# Patient Record
Sex: Male | Born: 1960 | Race: Black or African American | Hispanic: No | Marital: Married | Smoking: Never smoker
Health system: Southern US, Community
[De-identification: ages and names within clinical notes are randomized; demographics above are authoritative.]

## PROBLEM LIST (undated history)

## (undated) DIAGNOSIS — G894 Chronic pain syndrome: Secondary | ICD-10-CM

## (undated) DIAGNOSIS — U071 COVID-19: Secondary | ICD-10-CM

---

## 2019-06-23 ENCOUNTER — Ambulatory Visit: Payer: Self-pay | Admitting: Orthopaedic Surgery

## 2020-02-20 ENCOUNTER — Inpatient Hospital Stay (HOSPITAL_COMMUNITY)
Admission: EM | Admit: 2020-02-20 | Discharge: 2020-02-22 | DRG: 177 | Disposition: A | Discharge status: Home / Self Care | Payer: Medicaid Other | Attending: Family Medicine | Admitting: Family Medicine

## 2020-02-20 ENCOUNTER — Encounter (HOSPITAL_COMMUNITY): Payer: Self-pay

## 2020-02-20 ENCOUNTER — Emergency Department (HOSPITAL_COMMUNITY): Payer: Medicaid Other

## 2020-02-20 ENCOUNTER — Other Ambulatory Visit: Payer: Self-pay

## 2020-02-20 DIAGNOSIS — Z79891 Long term (current) use of opiate analgesic: Secondary | ICD-10-CM

## 2020-02-20 DIAGNOSIS — J1282 Pneumonia due to coronavirus disease 2019: Secondary | ICD-10-CM | POA: Diagnosis present

## 2020-02-20 DIAGNOSIS — I2694 Multiple subsegmental pulmonary emboli without acute cor pulmonale: Secondary | ICD-10-CM | POA: Diagnosis present

## 2020-02-20 DIAGNOSIS — U071 COVID-19: Principal | ICD-10-CM | POA: Diagnosis present

## 2020-02-20 DIAGNOSIS — F17210 Nicotine dependence, cigarettes, uncomplicated: Secondary | ICD-10-CM | POA: Diagnosis present

## 2020-02-20 DIAGNOSIS — G8929 Other chronic pain: Secondary | ICD-10-CM | POA: Diagnosis present

## 2020-02-20 DIAGNOSIS — D72819 Decreased white blood cell count, unspecified: Secondary | ICD-10-CM | POA: Diagnosis present

## 2020-02-20 DIAGNOSIS — M25569 Pain in unspecified knee: Secondary | ICD-10-CM | POA: Diagnosis present

## 2020-02-20 DIAGNOSIS — D696 Thrombocytopenia, unspecified: Secondary | ICD-10-CM | POA: Diagnosis present

## 2020-02-20 DIAGNOSIS — K5900 Constipation, unspecified: Secondary | ICD-10-CM | POA: Diagnosis present

## 2020-02-20 DIAGNOSIS — Z86711 Personal history of pulmonary embolism: Secondary | ICD-10-CM

## 2020-02-20 DIAGNOSIS — I2699 Other pulmonary embolism without acute cor pulmonale: Secondary | ICD-10-CM | POA: Diagnosis present

## 2020-02-20 LAB — CBC WITH DIFFERENTIAL/PLATELET
Abs Immature Granulocytes: 0.01 10*3/uL (ref 0.00–0.07)
Basophils Absolute: 0 10*3/uL (ref 0.0–0.1)
Basophils Relative: 0 %
Eosinophils Absolute: 0 10*3/uL (ref 0.0–0.5)
Eosinophils Relative: 0 %
HCT: 44.9 % (ref 39.0–52.0)
Hemoglobin: 13.7 g/dL (ref 13.0–17.0)
Immature Granulocytes: 0 %
Lymphocytes Relative: 19 %
Lymphs Abs: 0.7 10*3/uL (ref 0.7–4.0)
MCH: 22.8 pg — ABNORMAL LOW (ref 26.0–34.0)
MCHC: 30.5 g/dL (ref 30.0–36.0)
MCV: 74.7 fL — ABNORMAL LOW (ref 80.0–100.0)
Monocytes Absolute: 0.3 10*3/uL (ref 0.1–1.0)
Monocytes Relative: 9 %
Neutro Abs: 2.5 10*3/uL (ref 1.7–7.7)
Neutrophils Relative %: 72 %
Platelets: 142 10*3/uL — ABNORMAL LOW (ref 150–400)
RBC: 6.01 MIL/uL — ABNORMAL HIGH (ref 4.22–5.81)
RDW: 14.1 % (ref 11.5–15.5)
WBC: 3.5 10*3/uL — ABNORMAL LOW (ref 4.0–10.5)
nRBC: 0 % (ref 0.0–0.2)

## 2020-02-20 LAB — TROPONIN I (HIGH SENSITIVITY)
Troponin I (High Sensitivity): 5 ng/L (ref <18)
Troponin I (High Sensitivity): 5 ng/L (ref <18)

## 2020-02-20 LAB — BASIC METABOLIC PANEL
Anion gap: 10 (ref 5–15)
BUN: 11 mg/dL (ref 6–20)
CO2: 27 mmol/L (ref 22–32)
Calcium: 8.4 mg/dL — ABNORMAL LOW (ref 8.9–10.3)
Chloride: 100 mmol/L (ref 98–111)
Creatinine, Ser: 0.8 mg/dL (ref 0.61–1.24)
GFR calc Af Amer: >60 mL/min (ref >60)
GFR calc non Af Amer: >60 mL/min (ref >60)
Glucose, Bld: 107 mg/dL — ABNORMAL HIGH (ref 70–99)
Potassium: 3.7 mmol/L (ref 3.5–5.1)
Sodium: 137 mmol/L (ref 135–145)

## 2020-02-20 LAB — APTT: aPTT: 33 seconds (ref 24–36)

## 2020-02-20 LAB — PROTIME-INR
INR: 1.1 (ref 0.8–1.2)
Prothrombin Time: 13.8 seconds (ref 11.4–15.2)

## 2020-02-20 MED ORDER — IOHEXOL 350 MG/ML SOLN
100.0000 mL | Freq: Once | INTRAVENOUS | Status: AC | PRN
  Administered 2020-02-20: 100 mL via INTRAVENOUS

## 2020-02-20 MED ORDER — HEPARIN (PORCINE) 25000 UT/250ML-% IV SOLN
1300.0000 [IU]/h | INTRAVENOUS | Status: DC
  Administered 2020-02-21: 1300 [IU]/h via INTRAVENOUS
  Filled 2020-02-20: qty 250

## 2020-02-20 MED ORDER — FENTANYL CITRATE (PF) 100 MCG/2ML IJ SOLN
25.0000 ug | Freq: Once | INTRAMUSCULAR | Status: AC
  Administered 2020-02-20: 25 ug via INTRAVENOUS
  Filled 2020-02-20: qty 2

## 2020-02-20 MED ORDER — SODIUM CHLORIDE 0.9 % IV BOLUS
1000.0000 mL | Freq: Once | INTRAVENOUS | Status: AC
  Administered 2020-02-20: 1000 mL via INTRAVENOUS

## 2020-02-20 MED ORDER — HEPARIN BOLUS VIA INFUSION
5000.0000 [IU] | Freq: Once | INTRAVENOUS | Status: AC
  Administered 2020-02-21: 5000 [IU] via INTRAVENOUS
  Filled 2020-02-20: qty 5000

## 2020-02-20 NOTE — ED Triage Notes (Signed)
Pt BIB EMS from home. Pt tested COVID+ 5/17 and is c/o SOB. EMS reports 12 lead unremarkable. Pt ambulated from wheelchair with EMS to stretcher in room.  117/72 84 HR 96% RA

## 2020-02-20 NOTE — ED Provider Notes (Signed)
[facility]-EMERGENCY DEPT Provider Note   CSN: [id number] Arrival date & time: 02/20/20  1936     History Chief Complaint  Patient presents with  . COVID+  . Shortness of Breath    Brandon Jones is a 59 y.o. male with a past medical history of positive Covid test on 02/12/2020 after exposure to wife with Covid, PE not currently on anticoagulation presenting to the ED with a chief complaint of chest pain, shortness of breath.  States that he was asymptomatic when he was initially exposed to Covid.  Reports about 3 to 4 days ago started experiencing left-sided chest pain along with dyspnea on exertion.  Reports chest pain is worse with taking a deep breath.  He states this feels similar to the time that he was diagnosed with a PE about 2 years ago.  He was told to take his anticoagulant dedication for about 6 months and then discontinued it.  He was never told what his PE was provoked by.  He reports constipation for the past 4 days as well.  He has not tried any medications to help with his constipation.  He has been taking Aleve with some improvement in his chills.  He denies any leg swelling, vomiting, wheezing, urinary symptoms, injuries or falls. States that he takes hydrocodone daily for his chronic knee pain.  HPI     History reviewed. No pertinent past medical history.  There are no problems to display for this patient.   History reviewed. No pertinent surgical history.     History reviewed. No pertinent family history.  Social History   Tobacco Use  . Smoking status: Not on file  Substance Use Topics  . Alcohol use: Not on file  . Drug use: Not on file    Home Medications Prior to Admission medications   Medication Sig Start Date End Date Taking? Authorizing Provider  oxyCODONE-acetaminophen (PERCOCET) 7.5-325 MG tablet Take 1 tablet by mouth 3 (three) times daily as needed for moderate pain.  02/11/20  Yes [provider]     Allergies    Patient has no known allergies.  Review of Systems   Review of Systems  Constitutional: Negative for appetite change, chills and fever.  HENT: Negative for ear pain, rhinorrhea, sneezing and sore throat.   Eyes: Negative for photophobia and visual disturbance.  Respiratory: Positive for cough and shortness of breath. Negative for chest tightness and wheezing.   Cardiovascular: Positive for chest pain. Negative for palpitations.  Gastrointestinal: Positive for constipation. Negative for abdominal pain, blood in stool, diarrhea, nausea and vomiting.  Genitourinary: Negative for dysuria, hematuria and urgency.  Musculoskeletal: Negative for myalgias.  Skin: Negative for rash.  Neurological: Negative for dizziness, weakness and light-headedness.    Physical Exam Updated Vital Signs BP 104/67   Pulse 64   Temp 98 F (36.7 C) (Oral)   Resp (!) 22   SpO2 95%   Physical Exam Vitals and nursing note reviewed.  Constitutional:      General: He is not in acute distress.    Appearance: He is well-developed.  HENT:     Head: Normocephalic and atraumatic.     Nose: Nose normal.  Eyes:     General: No scleral icterus.       Left eye: No discharge.     Conjunctiva/sclera: Conjunctivae normal.  Cardiovascular:     Rate and Rhythm: Normal rate and regular rhythm.     Heart sounds: Normal heart sounds. No murmur.  No friction rub. No gallop.   Pulmonary:     Effort: Pulmonary effort is normal. Tachypnea present. No respiratory distress.     Breath sounds: Normal breath sounds.  Abdominal:     General: Bowel sounds are normal. There is no distension.     Palpations: Abdomen is soft.     Tenderness: There is no abdominal tenderness. There is no guarding.  Musculoskeletal:        General: Normal range of motion.     Cervical back: Normal range of motion and neck supple.     Right lower leg: No tenderness. No edema.     Left lower leg: No tenderness. No edema.  Skin:     General: Skin is warm and dry.     Findings: No rash.  Neurological:     Mental Status: He is alert.     Motor: No abnormal muscle tone.     Coordination: Coordination normal.     ED Results / Procedures / Treatments   Labs (all labs ordered are listed, but only abnormal results are displayed) Labs Reviewed  BASIC METABOLIC PANEL - Abnormal; Notable for the following components:      Result Value   Glucose, Bld 107 (*)    Calcium 8.4 (*)    All other components within normal limits  CBC WITH DIFFERENTIAL/PLATELET - Abnormal; Notable for the following components:   WBC 3.5 (*)    RBC 6.01 (*)    MCV 74.7 (*)    MCH 22.8 (*)    Platelets 142 (*)    All other components within normal limits  TROPONIN I (HIGH SENSITIVITY)  TROPONIN I (HIGH SENSITIVITY)    EKG None ED ECG REPORT   Date: 02/20/2020  Rate: 65   Rhythm: normal sinus rhythm  QRS Axis: normal  Intervals: normal  ST/T Wave abnormalities: normal  Conduction Disutrbances:none  Narrative Interpretation:   Old EKG Reviewed: none available  I have personally reviewed the EKG tracing and agree with the computerized printout as noted.  Radiology DG Chest Portable 1 View  Result Date: 02/20/2020 CLINICAL DATA:  Shortness of breath. COVID positive 02/12/2020. Cough. EXAM: PORTABLE CHEST 1 VIEW COMPARISON:  None. FINDINGS: Low lung volumes. Normal heart size for technique. Patchy and streaky opacities in both lung bases. Vague opacity in the right suprahilar lung. No pleural effusion or pneumothorax. No acute osseous abnormalities are seen. IMPRESSION: Low lung volumes with patchy bilateral lung opacities, suspicious for multifocal pneumonia in the setting of COVID-19. There is likely an element of superimposed atelectasis at the left lung base. Electronically Signed   By: Narda Rutherford M.D.   On: 02/20/2020 20:43    Procedures .Critical Care Performed by: Dietrich Pates, PA-C Authorized by: Dietrich Pates, PA-C    Critical care provider statement:    Critical care time (minutes):  35   Critical care was necessary to treat or prevent imminent or life-threatening deterioration of the following conditions:  Cardiac failure, circulatory failure and respiratory failure   Critical care was time spent personally by me on the following activities:  Development of treatment plan with patient or surrogate, discussions with consultants, evaluation of patient's response to treatment, examination of patient, obtaining history from patient or surrogate, ordering and review of laboratory studies, ordering and review of radiographic studies, pulse oximetry and re-evaluation of patient's condition   I assumed direction of critical care for this patient from another provider in my specialty: no     (including critical  care time)  Medications Ordered in ED Medications  sodium chloride 0.9 % bolus 1,000 mL (0 mLs Intravenous Stopped 02/20/20 2219)  fentaNYL (SUBLIMAZE) injection 25 mcg (25 mcg Intravenous Given 02/20/20 2037)  iohexol (OMNIPAQUE) 350 MG/ML injection 100 mL (100 mLs Intravenous Contrast Given 02/20/20 2205)    ED Course  I have reviewed the triage vital signs and the nursing notes.  Pertinent labs & imaging results that were available during my care of the patient were reviewed by me and considered in my medical decision making (see chart for details).    MDM Rules/Calculators/A&P                      59 year old male with a past medical history of Covid, positive test on 02/12/2020 after exposure to wife with Covid, prior PE not currently on anticoagulation presenting to the ED with a chief complaint of chest pain or shortness of breath.  Symptoms began 3 to 4 days ago.  Reports dyspnea on exertion.  Pain is worse with taking a deep breath.  He states that this was similar to the time he was diagnosed with PE about 2 years ago.  He is no longer on anticoagulation.  He was never told what provoked his PE.   Denies any leg swelling.  Patient with oxygen saturations on low 90s on exam.  He is not tachycardic.  He is tachypneic.  No lower extremity edema, erythema or calf tenderness that concern me for DVT.  Work-up here significant for white count of 3.5, troponin negative.  EKG shows sinus rhythm, no ischemic changes.  Chest x-ray findings consistent with Covid pneumonia.  CT angio of the chest done due to his Covid infection as well as history of PE.  He does have acute bilateral PEs with evidence of right heart strain.  Will order heparin, admit patient to hospital service for ongoing management of his PE.  All imaging, if done today, including plain films, CT scans, and ultrasounds, independently reviewed by me, and interpretations confirmed via formal radiology reads.  Portions of this note were generated with [employer]. Dictation errors may occur despite best attempts at proofreading.  Final Clinical Impression(s) / ED Diagnoses Final diagnoses:  Multiple subsegmental pulmonary emboli without acute cor pulmonale ([place])  COVID-19 virus infection    Rx / DC Orders ED Discharge Orders    None       Delia Heady, PA-C 02/20/20 2232    Quintella Reichert, MD 02/22/20 [phone]

## 2020-02-20 NOTE — H&P (Addendum)
History and Physical   [facility] Admission History and Physical [employer], D.O.    Patient Name: Brandon Jones MR#: [id number] Date of Birth: [birth year] Date of Admission: 02/20/2020  Referring MD/NP/PA: PA Khatri  Primary Care Physician: System, Pcp Not In  Chief Complaint:  Chief Complaint  Patient presents with  . COVID+  . Shortness of Breath    HPI: Brandon Jones is a 59 y.o. male with a known history of chronic knee pain, PE 2 years ago, unknown etiology, was on [facility] for 6 months and then stopped.  presents to the emergency department for evaluation of shortness of breath.  Patient was exposed to his wife who had Covid and he himself was diagnosed with Covid positive on May 17, but was asymptomatic.  Four days ago he reports the onset of left-sided chest pain with dyspnea on exertion, worse with inspiration.  He also reports constipation for the past 4 days.  Patient denies fevers/chills, weakness, dizziness, chest pain, shortness of breath, N/V/C/D, abdominal pain, dysuria/frequency, changes in mental status.    Otherwise there has been no change in status. Patient has been taking medication as prescribed and there has been no recent change in medication or diet.  No recent antibiotics.  There has been no recent illness, hospitalizations, travel or sick contacts.    EMS/ED Course: Patient received fentanyl, heparin, normal saline. Medical admission has been requested for further management of bilateral PE.  Review of Systems:  CONSTITUTIONAL: No fever/chills, fatigue, weakness, weight gain/loss, headache. EYES: No blurry or double vision. ENT: No tinnitus, postnasal drip, redness or soreness of the oropharynx. RESPIRATORY: Positive dyspnea on exertion.  No cough, wheeze.  No hemoptysis.  CARDIOVASCULAR: Positive chest pain, negative palpitations, syncope, orthopnea. No lower extremity edema.  GASTROINTESTINAL: Positive  constipation.  No nausea, vomiting, abdominal pain, diarrhea. No hematemesis, melena or hematochezia. GENITOURINARY: No dysuria, frequency, hematuria. ENDOCRINE: No polyuria or nocturia. No heat or cold intolerance. HEMATOLOGY: No anemia, bruising, bleeding. INTEGUMENTARY: No rashes, ulcers, lesions. MUSCULOSKELETAL: No arthritis, gout, dyspnea. NEUROLOGIC: No numbness, tingling, ataxia, seizure-type activity, weakness. PSYCHIATRIC: No anxiety, depression, insomnia.   Past medical history: Chronic knee pain, PE, Covid + 02/12/2020  History reviewed. No pertinent surgical history.  Smokes 4-5 cigarettes / day  No Known Allergies  History reviewed. No pertinent family history.  Prior to Admission medications   Medication Sig Start Date End Date Taking? Authorizing Provider  oxyCODONE-acetaminophen (PERCOCET) 7.5-325 MG tablet Take 1 tablet by mouth 3 (three) times daily as needed for moderate pain.  02/11/20  Yes [provider]    Physical Exam: Vitals:   02/20/20 1951 02/20/20 2030 02/20/20 2100 02/20/20 2236  BP: 121/84 103/67 104/67 (!) 93/55  Pulse: 66 66 64 66  Resp: 20 (!) 24 (!) 22 19  Temp: 98 F (36.7 C)     TempSrc: Oral     SpO2: 98% (!) 89% 95% 96%    GENERAL: 59 y.o.-year-old male patient, well-developed, well-nourished lying in the bed in no acute distress.  Pleasant and cooperative.   HEENT: Head atraumatic, normocephalic. Pupils equal. Mucus membranes moist. NECK: Supple. No JVD. CHEST: Normal breath sounds bilaterally. No wheezing, rales, rhonchi or crackles. No use of accessory muscles of respiration.  No reproducible chest wall tenderness.  CARDIOVASCULAR: S1, S2 normal. No murmurs, rubs, or gallops. Cap refill <2 seconds. Pulses intact distally.  ABDOMEN: Soft, NT EXTREMITIES: No pedal edema, cyanosis, or clubbing. No calf tenderness or Homan's  sign.  NEUROLOGIC: The patient is alert and oriented x 3. Cranial nerves II through XII are grossly  intact with no focal sensorimotor deficit. PSYCHIATRIC:  Normal affect, mood, thought content. SKIN: Warm, dry, and intact without obvious rash, lesion, or ulcer.    Labs on Admission:  CBC: Recent Labs  Lab 02/20/20 2015  WBC 3.5*  NEUTROABS 2.5  HGB 13.7  HCT 44.9  MCV 74.7*  PLT 607*   Basic Metabolic Panel: Recent Labs  Lab 02/20/20 2015  NA 137  K 3.7  CL 100  CO2 27  GLUCOSE 107*  BUN 11  CREATININE 0.80  CALCIUM 8.4*   GFR: CrCl cannot be calculated (Unknown ideal weight.). Liver Function Tests: No results for input(s): AST, ALT, ALKPHOS, BILITOT, PROT, ALBUMIN in the last 168 hours. No results for input(s): LIPASE, AMYLASE in the last 168 hours. No results for input(s): AMMONIA in the last 168 hours. Coagulation Profile: Recent Labs  Lab 02/20/20 2035  INR 1.1   Cardiac Enzymes: No results for input(s): CKTOTAL, CKMB, CKMBINDEX, TROPONINI in the last 168 hours. BNP (last 3 results) No results for input(s): PROBNP in the last 8760 hours. HbA1C: No results for input(s): HGBA1C in the last 72 hours. CBG: No results for input(s): GLUCAP in the last 168 hours. Lipid Profile: No results for input(s): CHOL, HDL, LDLCALC, TRIG, CHOLHDL, LDLDIRECT in the last 72 hours. Thyroid Function Tests: No results for input(s): TSH, T4TOTAL, FREET4, T3FREE, THYROIDAB in the last 72 hours. Anemia Panel: No results for input(s): VITAMINB12, FOLATE, FERRITIN, TIBC, IRON, RETICCTPCT in the last 72 hours. Urine analysis: No results found for: COLORURINE, APPEARANCEUR, LABSPEC, PHURINE, GLUCOSEU, HGBUR, BILIRUBINUR, KETONESUR, PROTEINUR, UROBILINOGEN, NITRITE, LEUKOCYTESUR Sepsis Labs: @LABRCNTIP (procalcitonin:4,lacticidven:4) )No results found for this or any previous visit (from the past 240 hour(s)).   Radiological Exams on Admission: CT Angio Chest PE W/Cm &/Or Wo Cm  Result Date: 02/20/2020 CLINICAL DATA:  Shortness of breath. COVID positive. Prior pulmonary  embolus. EXAM: CT ANGIOGRAPHY CHEST WITH CONTRAST TECHNIQUE: Multidetector CT imaging of the chest was performed using the standard protocol during bolus administration of intravenous contrast. Multiplanar CT image reconstructions and MIPs were obtained to evaluate the vascular anatomy. CONTRAST:  120mL OMNIPAQUE IOHEXOL 350 MG/ML SOLN COMPARISON:  Chest radiograph earlier this day. FINDINGS: Cardiovascular: Acute pulmonary emboli in the distal left pulmonary artery extending into the upper, lower, and lingular pulmonary arteries. Lower lobe thrombus may be partially occlusive at the basilar segment. There are small a centric filling defects within subsegmental right lower lobe pulmonary arteries that are age indeterminate. Overall thromboembolic burden is moderate. RV to LV ratio is 1. Minimal contrast refluxes into the hepatic veins and IVC. Slight straightening of the intraventricular septum. Heart is normal in size. No pericardial effusion. Mediastinum/Nodes: Enlarged right hilar node measuring 17 mm. There is a prominent subcarinal node at 13 mm. Shotty left hilar and additional mediastinal lymph nodes. No thyroid nodule. No esophageal wall thickening. Lungs/Pleura: Multifocal ground-glass opacities throughout both lungs, peripheral predominant, consistent with COVID-19 pneumonia. Linear atelectasis in the left lower lobe. Basilar evaluation is partially obscured by breathing motion artifact. Possible early pulmonary infarct at the left lung base. No significant pleural effusion. The trachea and mainstem bronchi are patent. Upper Abdomen: Minimal contrast flexing into hepatic veins and IVC. No other acute finding. Musculoskeletal: There are no acute or suspicious osseous abnormalities. Degenerative change in the thoracic and included cervical spine. Review of the MIP images confirms the above findings. IMPRESSION: 1. Positive  for acute bilateral PE, left greater than right. Thromboembolic burden is moderate.  There is CT evidence of right heart strain (RV/LV Ratio = 1) consistent with at least submassive (intermediate risk) PE. The presence of right heart strain has been associated with an increased risk of morbidity and mortality. 2. Multifocal ground-glass opacities throughout both lungs, consistent with COVID-19 pneumonia. Possible early pulmonary infarct at the left lung base. 3. Mediastinal and right hilar adenopathy is likely reactive. Critical Value/emergent results were called by telephone at the time of interpretation on 02/20/2020 at 10:22 pm to PA [facility] , who verbally acknowledged these results. Electronically Signed   By: Narda Rutherford M.D.   On: 02/20/2020 22:22   DG Chest Portable 1 View  Result Date: 02/20/2020 CLINICAL DATA:  Shortness of breath. COVID positive 02/12/2020. Cough. EXAM: PORTABLE CHEST 1 VIEW COMPARISON:  None. FINDINGS: Low lung volumes. Normal heart size for technique. Patchy and streaky opacities in both lung bases. Vague opacity in the right suprahilar lung. No pleural effusion or pneumothorax. No acute osseous abnormalities are seen. IMPRESSION: Low lung volumes with patchy bilateral lung opacities, suspicious for multifocal pneumonia in the setting of COVID-19. There is likely an element of superimposed atelectasis at the left lung base. Electronically Signed   By: Narda Rutherford M.D.   On: 02/20/2020 20:43    EKG: Normal sinus rhythm at 65 bpm with normal axis and nonspecific ST-T wave changes.   Assessment/Plan  This is a 59 y.o. male with a history of chronic knee pain, PE now being admitted with:  #.  Bilateral PEs with acute heart strain -Admit inpatient telemetry -O2, nebs as needed -Heparin drip  #.  Covid pneumonia, meeting sepsis criteria.  -IV dexamethasone -Check LFTs -Remdesivir if LFTs are wnl -Follow D-dimer, ferritin - Check resp panel, flu and sputum culture -serial lactate  #.  Chest pain, likely secondary to above -Monitor on  telemetry -Trend troponins  Admission status: Inpatient IV Fluids: Hep-Lock Diet/Nutrition: Regular Consults called: Pulmonary DVT Px: Heparin and early ambulation. Code Status: Full Code  Disposition Plan: To home in 1-2 days  All the records are reviewed and case discussed with ED provider. Management plans discussed with the patient and/or family who express understanding and agree with plan of care.  Brandon Jones D.O. on 02/20/2020 at 11:54 PM CC: Primary care physician; System, Pcp Not In   02/20/2020, 11:54 PM

## 2020-02-20 NOTE — Progress Notes (Signed)
ANTICOAGULATION CONSULT NOTE - Initial Consult  Pharmacy Consult for Heparin  Indication: pulmonary embolus  No Known Allergies  Patient Measurements:   Heparin Dosing Weight: 77.3 kg  Vital Signs: Temp: 98 F (36.7 C) (05/25 1951) Temp Source: Oral (05/25 1951) BP: 104/67 (05/25 2100) Pulse Rate: 64 (05/25 2100)  Labs: Recent Labs    02/20/20 2015  HGB 13.7  HCT 44.9  PLT 142*  CREATININE 0.80  TROPONINIHS 5    CrCl cannot be calculated (Unknown ideal weight.).   Medical History: History reviewed. No pertinent past medical history.  Medications:  Not on anticoagulation PTA  Assessment: 59 y/o M with a h/o PE no longer on anticoagulation and a recent diagnosis ov COVID-19 infection admitted with PE.   Goal of Therapy:  Heparin level 0.3-0.7 units/ml Monitor platelets by anticoagulation protocol: Yes   Plan:  Give 5000 units bolus x 1 Start heparin infusion at 1300 units/hr Check anti-Xa level in 6 hours and daily while on heparin Continue to monitor H&H and platelets  Luisa Hart D 02/20/2020,10:33 PM

## 2020-02-20 NOTE — ED Notes (Signed)
Upon arrival to room, pt requesting food and for the TV to be turned on. Pt 98% RA while speaking to RN.

## 2020-02-21 ENCOUNTER — Encounter (HOSPITAL_COMMUNITY): Payer: Self-pay | Admitting: Family Medicine

## 2020-02-21 ENCOUNTER — Inpatient Hospital Stay (HOSPITAL_COMMUNITY): Payer: Medicaid Other

## 2020-02-21 DIAGNOSIS — D72819 Decreased white blood cell count, unspecified: Secondary | ICD-10-CM | POA: Diagnosis present

## 2020-02-21 DIAGNOSIS — Z79891 Long term (current) use of opiate analgesic: Secondary | ICD-10-CM | POA: Diagnosis not present

## 2020-02-21 DIAGNOSIS — G8929 Other chronic pain: Secondary | ICD-10-CM | POA: Diagnosis present

## 2020-02-21 DIAGNOSIS — F17210 Nicotine dependence, cigarettes, uncomplicated: Secondary | ICD-10-CM | POA: Diagnosis present

## 2020-02-21 DIAGNOSIS — D696 Thrombocytopenia, unspecified: Secondary | ICD-10-CM | POA: Diagnosis present

## 2020-02-21 DIAGNOSIS — M25569 Pain in unspecified knee: Secondary | ICD-10-CM | POA: Diagnosis present

## 2020-02-21 DIAGNOSIS — J1282 Pneumonia due to coronavirus disease 2019: Secondary | ICD-10-CM | POA: Diagnosis present

## 2020-02-21 DIAGNOSIS — Z86711 Personal history of pulmonary embolism: Secondary | ICD-10-CM | POA: Diagnosis not present

## 2020-02-21 DIAGNOSIS — I2602 Saddle embolus of pulmonary artery with acute cor pulmonale: Secondary | ICD-10-CM | POA: Diagnosis not present

## 2020-02-21 DIAGNOSIS — I2699 Other pulmonary embolism without acute cor pulmonale: Secondary | ICD-10-CM

## 2020-02-21 DIAGNOSIS — I2694 Multiple subsegmental pulmonary emboli without acute cor pulmonale: Secondary | ICD-10-CM | POA: Diagnosis present

## 2020-02-21 DIAGNOSIS — K5900 Constipation, unspecified: Secondary | ICD-10-CM | POA: Diagnosis present

## 2020-02-21 DIAGNOSIS — U071 COVID-19: Secondary | ICD-10-CM | POA: Diagnosis present

## 2020-02-21 HISTORY — DX: Other pulmonary embolism without acute cor pulmonale: I26.99

## 2020-02-21 LAB — C-REACTIVE PROTEIN: CRP: 5.9 mg/dL — ABNORMAL HIGH (ref <1.0)

## 2020-02-21 LAB — TROPONIN I (HIGH SENSITIVITY)
Troponin I (High Sensitivity): 5 ng/L (ref <18)
Troponin I (High Sensitivity): 6 ng/L (ref <18)

## 2020-02-21 LAB — HEPATIC FUNCTION PANEL
ALT: 18 U/L (ref 0–44)
AST: 21 U/L (ref 15–41)
Albumin: 3 g/dL — ABNORMAL LOW (ref 3.5–5.0)
Alkaline Phosphatase: 37 U/L — ABNORMAL LOW (ref 38–126)
Bilirubin, Direct: 0.1 mg/dL (ref 0.0–0.2)
Indirect Bilirubin: 0.7 mg/dL (ref 0.3–0.9)
Total Bilirubin: 0.8 mg/dL (ref 0.3–1.2)
Total Protein: 6 g/dL — ABNORMAL LOW (ref 6.5–8.1)

## 2020-02-21 LAB — CBC
HCT: 42.1 % (ref 39.0–52.0)
Hemoglobin: 13.1 g/dL (ref 13.0–17.0)
MCH: 23.2 pg — ABNORMAL LOW (ref 26.0–34.0)
MCHC: 31.1 g/dL (ref 30.0–36.0)
MCV: 74.6 fL — ABNORMAL LOW (ref 80.0–100.0)
Platelets: 143 10*3/uL — ABNORMAL LOW (ref 150–400)
RBC: 5.64 MIL/uL (ref 4.22–5.81)
RDW: 14.1 % (ref 11.5–15.5)
WBC: 3.2 10*3/uL — ABNORMAL LOW (ref 4.0–10.5)
nRBC: 0 % (ref 0.0–0.2)

## 2020-02-21 LAB — ECHOCARDIOGRAM COMPLETE
Height: 69 in
Weight: 2592 oz

## 2020-02-21 LAB — HEPARIN LEVEL (UNFRACTIONATED)
Heparin Unfractionated: 1.03 IU/mL — ABNORMAL HIGH (ref 0.30–0.70)
Heparin Unfractionated: 0.88 IU/mL — ABNORMAL HIGH (ref 0.30–0.70)
Heparin Unfractionated: 0.57 IU/mL (ref 0.30–0.70)

## 2020-02-21 LAB — LACTATE DEHYDROGENASE: LDH: 174 U/L (ref 98–192)

## 2020-02-21 LAB — BASIC METABOLIC PANEL
Anion gap: 8 (ref 5–15)
BUN: 11 mg/dL (ref 6–20)
CO2: 25 mmol/L (ref 22–32)
Calcium: 8.4 mg/dL — ABNORMAL LOW (ref 8.9–10.3)
Chloride: 103 mmol/L (ref 98–111)
Creatinine, Ser: 0.78 mg/dL (ref 0.61–1.24)
GFR calc Af Amer: >60 mL/min (ref >60)
GFR calc non Af Amer: >60 mL/min (ref >60)
Glucose, Bld: 124 mg/dL — ABNORMAL HIGH (ref 70–99)
Potassium: 3.9 mmol/L (ref 3.5–5.1)
Sodium: 136 mmol/L (ref 135–145)

## 2020-02-21 LAB — HIV ANTIBODY (ROUTINE TESTING W REFLEX): HIV Screen 4th Generation wRfx: NONREACTIVE

## 2020-02-21 LAB — ABO/RH: ABO/RH(D): AB POS

## 2020-02-21 LAB — PHOSPHORUS: Phosphorus: 1.9 mg/dL — ABNORMAL LOW (ref 2.5–4.6)

## 2020-02-21 LAB — FERRITIN: Ferritin: 454 ng/mL — ABNORMAL HIGH (ref 24–336)

## 2020-02-21 LAB — MAGNESIUM: Magnesium: 1.9 mg/dL (ref 1.7–2.4)

## 2020-02-21 MED ORDER — SODIUM CHLORIDE 0.9 % IV SOLN: 100.0000 mg | INTRAVENOUS | Status: AC

## 2020-02-21 MED ORDER — ONDANSETRON HCL 4 MG PO TABS: 4.0000 mg | ORAL_TABLET | Freq: Four times a day (QID) | ORAL | Status: DC | PRN

## 2020-02-21 MED ORDER — MORPHINE SULFATE (PF) 2 MG/ML IV SOLN: 1.0000 mg | INTRAVENOUS | Status: DC | PRN

## 2020-02-21 MED ORDER — DEXAMETHASONE SODIUM PHOSPHATE 10 MG/ML IJ SOLN
6.0000 mg | INTRAMUSCULAR | Status: DC
  Administered 2020-02-21: 6 mg via INTRAVENOUS
  Filled 2020-02-21 (×3): qty 1

## 2020-02-21 MED ORDER — OXYCODONE-ACETAMINOPHEN 7.5-325 MG PO TABS
1.0000 | ORAL_TABLET | Freq: Three times a day (TID) | ORAL | Status: DC | PRN
  Administered 2020-02-21 – 2020-02-22 (×2): 1 via ORAL
  Filled 2020-02-21 (×2): qty 1

## 2020-02-21 MED ORDER — ONDANSETRON HCL 4 MG/2ML IJ SOLN
4.0000 mg | Freq: Four times a day (QID) | INTRAMUSCULAR | Status: DC | PRN
  Administered 2020-02-22: 4 mg via INTRAVENOUS
  Filled 2020-02-21: qty 2

## 2020-02-21 MED ORDER — SENNOSIDES-DOCUSATE SODIUM 8.6-50 MG PO TABS
1.0000 | ORAL_TABLET | Freq: Every evening | ORAL | Status: DC | PRN
  Administered 2020-02-22: 1 via ORAL
  Filled 2020-02-21: qty 1

## 2020-02-21 MED ORDER — ACETAMINOPHEN 325 MG PO TABS
650.0000 mg | ORAL_TABLET | Freq: Four times a day (QID) | ORAL | Status: DC | PRN
  Administered 2020-02-21 – 2020-02-22 (×2): 650 mg via ORAL
  Filled 2020-02-21 (×2): qty 2

## 2020-02-21 MED ORDER — GUAIFENESIN-DM 100-10 MG/5ML PO SYRP: 10.0000 mL | ORAL_SOLUTION | ORAL | Status: DC | PRN

## 2020-02-21 MED ORDER — SODIUM CHLORIDE 0.9 % IV SOLN
100.0000 mg | Freq: Every day | INTRAVENOUS | Status: DC
  Administered 2020-02-22: 100 mg via INTRAVENOUS
  Filled 2020-02-21: qty 20

## 2020-02-21 MED ORDER — ZINC SULFATE 220 (50 ZN) MG PO CAPS
220.0000 mg | ORAL_CAPSULE | Freq: Every day | ORAL | Status: DC
  Administered 2020-02-21 – 2020-02-22 (×2): 220 mg via ORAL
  Filled 2020-02-21 (×2): qty 1

## 2020-02-21 MED ORDER — ALBUTEROL SULFATE HFA 108 (90 BASE) MCG/ACT IN AERS
2.0000 | INHALATION_SPRAY | Freq: Four times a day (QID) | RESPIRATORY_TRACT | Status: DC
  Administered 2020-02-21 – 2020-02-22 (×4): 2 via RESPIRATORY_TRACT
  Filled 2020-02-21: qty 6.7

## 2020-02-21 MED ORDER — HEPARIN (PORCINE) 25000 UT/250ML-% IV SOLN
950.0000 [IU]/h | INTRAVENOUS | Status: DC
  Administered 2020-02-21: 1150 [IU]/h via INTRAVENOUS
  Administered 2020-02-21: 950 [IU]/h via INTRAVENOUS
  Filled 2020-02-21: qty 250

## 2020-02-21 MED ORDER — ACETAMINOPHEN 650 MG RE SUPP: 650.0000 mg | Freq: Four times a day (QID) | RECTAL | Status: DC | PRN

## 2020-02-21 MED ORDER — BISACODYL 5 MG PO TBEC
5.0000 mg | DELAYED_RELEASE_TABLET | Freq: Every day | ORAL | Status: DC | PRN
  Administered 2020-02-21: 5 mg via ORAL
  Filled 2020-02-21: qty 1

## 2020-02-21 MED ORDER — ASCORBIC ACID 500 MG PO TABS
500.0000 mg | ORAL_TABLET | Freq: Every day | ORAL | Status: DC
  Administered 2020-02-21 – 2020-02-22 (×2): 500 mg via ORAL
  Filled 2020-02-21 (×2): qty 1

## 2020-02-21 MED ORDER — SODIUM CHLORIDE 0.9 % IV SOLN
100.0000 mg | INTRAVENOUS | Status: AC
  Administered 2020-02-21 (×2): 100 mg via INTRAVENOUS
  Filled 2020-02-21 (×2): qty 20

## 2020-02-21 NOTE — Plan of Care (Signed)
  Problem: Education: Goal: Knowledge of risk factors and measures for prevention of condition will improve Outcome: Progressing   Problem: Coping: Goal: Psychosocial and spiritual needs will be supported Outcome: Progressing   Problem: Respiratory: Goal: Will maintain a patent airway Outcome: Progressing Goal: Complications related to the disease process, condition or treatment will be avoided or minimized Outcome: Progressing   

## 2020-02-21 NOTE — Progress Notes (Signed)
ANTICOAGULATION CONSULT NOTE - Follow Up Consult  Pharmacy Consult for heparin Indication: acute pulmonary embolus  No Known Allergies  Patient Measurements: Height: 5\' 9"  (175.3 cm) Weight: 73.5 kg (162 lb) IBW/kg (Calculated) : 70.7 Heparin Dosing Weight: 73 kg  Vital Signs: Temp: 98.5 F (36.9 C) (05/26 1456) Temp Source: Oral (05/26 1456) BP: 105/67 (05/26 1456) Pulse Rate: 60 (05/26 1456)  Labs: Recent Labs    02/20/20 2015 02/20/20 2015 02/20/20 2035 02/20/20 2220 02/21/20 0143 02/21/20 0156 02/21/20 0600 02/21/20 0608 02/21/20 1512  HGB 13.7  --   --   --   --  13.1  --   --   --   HCT 44.9  --   --   --   --  42.1  --   --   --   PLT 142*  --   --   --   --  143*  --   --   --   APTT  --   --  33  --   --   --   --   --   --   LABPROT  --   --  13.8  --   --   --   --   --   --   INR  --   --  1.1  --   --   --   --   --   --   HEPARINUNFRC  --   --   --   --   --   --  1.03*  --  0.88*  CREATININE 0.80  --   --   --   --   --  0.78  --   --   TROPONINIHS 5   < >  --  5 5  --   --  6  --    < > = values in this interval not displayed.    Estimated Creatinine Clearance: 100.6 mL/min (by C-G formula based on SCr of 0.78 mg/dL).  Assessment: Patient is a 59 y.o with hx PE two years ago (currently not on anticoag. PTA), recently diagnosed with COVID (02/12/20) presented to the ED on 5/25 with c/o SOB and chest pain. Chest CTA showed acute bilateral PE with evidence of right hear strain.  He's currently on heparin drip for acute VTE.  Today, 02/21/2020: - first heparin level is supra-therapeutic at 1.03 - repeat heparin level remains above goal at 0.88 on 1150 units/hr  - cbc stable - No bleeding or infusion related issues reported by RN  Goal of Therapy:  Heparin level 0.3-0.7 units/ml Monitor platelets by anticoagulation protocol: Yes   Plan:  - Decrease heparin infusion to 950 units/hr - check heparin level 6h after rate change - monitor for s/s  bleeding - Noted plan to transition to Xarelto prior to discharge  02/23/2020 PharmD, BCPS 02/21/2020,3:53 PM

## 2020-02-21 NOTE — Progress Notes (Signed)
ANTICOAGULATION CONSULT NOTE - Follow Up Consult  Pharmacy Consult for heparin Indication: acute pulmonary embolus  No Known Allergies  Patient Measurements: Height: 5\' 9"  (175.3 cm) Weight: 73.5 kg (162 lb) IBW/kg (Calculated) : 70.7 Heparin Dosing Weight: 73 kg  Vital Signs: Temp: 97.7 F (36.5 C) (05/26 0614) Temp Source: Oral (05/26 0614) BP: 103/68 (05/26 0614) Pulse Rate: 60 (05/26 0614)  Labs: Recent Labs    02/20/20 2015 02/20/20 2035 02/20/20 2220 02/21/20 0143 02/21/20 0156  HGB 13.7  --   --   --  13.1  HCT 44.9  --   --   --  42.1  PLT 142*  --   --   --  143*  APTT  --  33  --   --   --   LABPROT  --  13.8  --   --   --   INR  --  1.1  --   --   --   CREATININE 0.80  --   --   --   --   TROPONINIHS 5  --  5 5  --     Estimated Creatinine Clearance: 100.6 mL/min (by C-G formula based on SCr of 0.8 mg/dL).  Assessment: Patient is a 59 y.o with hx PE two years ago (currently not on anticoag. PTA), recently diagnosed with COVID (02/12/20) presented to the ED on 5/25 with c/o SOB and chest pain. Chest CTA showed acute bilateral PE with evidence of right hear strain.  He's currently on heparin drip for acute VTE.  Today, 02/21/2020: - first heparin level is supra-therapeutic at 1.03 - cbc stable  Goal of Therapy:  Heparin level 0.3-0.7 units/ml Monitor platelets by anticoagulation protocol: Yes   Plan:  - hold heparin drip for 1 hour, then resume at 1150 units/hr - check 6 hr heparin level - monitor for s/s bleeding  Rhylie Stehr P 02/21/2020,6:31 AM

## 2020-02-21 NOTE — Progress Notes (Signed)
  Echocardiogram 2D Echocardiogram has been performed.  Celene Skeen 02/21/2020, 4:20 PM

## 2020-02-21 NOTE — Progress Notes (Signed)
PROGRESS NOTE  Brandon Jones  VQQ:[id number] DOB: [birth year] DOA: 02/20/2020 PCP: System, Pcp Not In   Brief Narrative: Brandon Jones is a 59 y.o. 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 CTA chest demonstrated diffuse ground glass opacities and moderate clot burden of bilateral PEs with RV: LV = 1 consistent with right heart strain. IV heparin was started and he was admitted late last night. LFTs have returned normal and remdesivir is started. Echocardiogram is pending, though troponin has remained nonelevated.   Assessment & Plan: Active Problems:   Bilateral pulmonary embolism (HCC)  Acute bilateral pulmonary emboli with right heart strain: Hemodynamically remaining stable.  - Echocardiogram pending.  - Will continue IV anticoagulation with plans to convert to DOAC (has tolerated xarelto previously and wishes to use this at discharge) in 24-48 hours.   Covid-19 pneumonia: SARS-CoV-2 PCR positive on 5/17. Leukopenic, thrombocytopenic with +GGOs on CTA chest. - Start remdesivir x5 days (5/26 - 5/30) - Steroids given initially, if remains without hypoxemia, would likely not benefit from continuing steroid - Vitamin C, zinc - Encourage OOB, IS, FV, and awake proning if able - Tylenol and antitussives prn - Continue airborne, contact precautions. Isolation period is recommended for 21 days from positive testing due to admission/pneumonia. - Check CBC w/diff, CMP, CRP (pending) ordered daily - Maintain euvolemia/net negative.  - Avoid NSAIDs   Microcytosis:  - Consider iron studies following acute illness.  DVT prophylaxis: IV heparin Code Status: Full Family Communication: None at bedside, pt reports he'll update his wife. Disposition Plan:  Status is: Inpatient  Remains inpatient appropriate because:Ongoing diagnostic testing needed  not appropriate for outpatient work up and IV treatments appropriate due to intensity of illness or inability to take PO   Dispo: The patient is from: Home              Anticipated d/c is to: Home              Anticipated d/c date is: 1 day              Patient currently is not medically stable to d/c.  Consultants:   None  Procedures:   Echocardiogram 02/21/2020 pending  Antimicrobials:  Remdesivir 5/26 - 5/30   Subjective: Shortness of breath with exertion only, chest pain is stable, with breathing and not necessarily with exertion. No leg swelling, denies any bleeding/abnormal bruising. Wants to go home. Frustrated with recommendations regarding isolation period.  Objective: Vitals:   02/21/20 0150 02/21/20 0614 02/21/20 1057 02/21/20 1105  BP: 102/66 103/68 102/64   Pulse: 65 60 (!) 56   Resp: 20 17 20    Temp: (!) 101.1 F (38.4 C) 97.7 F (36.5 C)  (!) 97.5 F (36.4 C)  TempSrc: Oral Oral  Oral  SpO2: 98% 98% 95%   Weight: 73.5 kg     Height: 5\' 9"  (1.753 m)       Intake/Output Summary (Last 24 hours) at 02/21/2020 1412 Last data filed at 02/21/2020 0900 Gross per 24 hour  Intake 240 ml  Output 325 ml  Net -85 ml   Filed Weights   02/21/20 0150  Weight: 73.5 kg    Gen: 59 y.o. male in no distress Pulm: Non-labored but tachypneic at rest, crackles bialterally.  CV: Regular bradycardia. No murmur, rub, or gallop. No JVD, No significant pedal edema. GI:  Abdomen soft, non-tender, non-distended, with normoactive bowel sounds. No organomegaly or masses felt. Ext: Warm, no deformities Skin: No rashes, lesions or ulcers Neuro: Alert and oriented. No focal neurological deficits. Psych: Judgement and insight appear normal. Mood & affect appropriate.   Data Reviewed: I have personally reviewed following labs and imaging studies  CBC: Recent Labs  Lab 02/20/20 2015 02/21/20 0156  WBC 3.5* 3.2*  NEUTROABS 2.5  --   HGB 13.7 13.1  HCT 44.9 42.1  MCV 74.7*  74.6*  PLT 142* 034*   Basic Metabolic Panel: Recent Labs  Lab 02/20/20 2015 02/21/20 0143 02/21/20 0600  NA 137  --  136  K 3.7  --  3.9  CL 100  --  103  CO2 27  --  25  GLUCOSE 107*  --  124*  BUN 11  --  11  CREATININE 0.80  --  0.78  CALCIUM 8.4*  --  8.4*  MG  --  1.9  --   PHOS  --  1.9*  --    GFR: Estimated Creatinine Clearance: 100.6 mL/min (by C-G formula based on SCr of 0.78 mg/dL). Liver Function Tests: Recent Labs  Lab 02/21/20 0143  AST 21  ALT 18  ALKPHOS 37*  BILITOT 0.8  PROT 6.0*  ALBUMIN 3.0*   No results for input(s): LIPASE, AMYLASE in the last 168 hours. No results for input(s): AMMONIA in the last 168 hours. Coagulation Profile: Recent Labs  Lab 02/20/20 2035  INR 1.1   Cardiac Enzymes: No results for input(s): CKTOTAL, CKMB, CKMBINDEX, TROPONINI in the last 168 hours. BNP (last 3 results) No results for input(s): PROBNP in the last 8760 hours. HbA1C: No results for input(s): HGBA1C in the last 72 hours. CBG: No results for input(s): GLUCAP in the last 168 hours. Lipid Profile: No results for input(s): CHOL, HDL, LDLCALC, TRIG, CHOLHDL, LDLDIRECT in the last 72 hours. Thyroid Function Tests: No results for input(s): TSH, T4TOTAL, FREET4, T3FREE, THYROIDAB in the last 72 hours. Anemia Panel: No results for input(s): VITAMINB12, FOLATE, FERRITIN, TIBC, IRON, RETICCTPCT in the last 72 hours. Urine analysis: No results found for: COLORURINE, APPEARANCEUR, LABSPEC, PHURINE, GLUCOSEU, HGBUR, BILIRUBINUR, KETONESUR, PROTEINUR, UROBILINOGEN, NITRITE, LEUKOCYTESUR Recent Results (from the past 240 hour(s))  Culture, blood (Routine X 2) w Reflex to ID Panel     Status: None (Preliminary result)   Collection Time: 02/21/20  1:43 AM   Specimen: BLOOD  Result Value Ref Range Status   Specimen Description   Final    BLOOD LEFT ARM Performed at [facility], [place] [address], [place], [place] [address]    Special Requests    Final    BOTTLES DRAWN AEROBIC AND ANAEROBIC Blood Culture adequate volume Performed at [facility] [address], [place], [place] [address]    Culture   Final    NO GROWTH < 12 HOURS Performed at [facility] [address], [place], [place] [address]    Report Status PENDING  Incomplete  Culture, blood (Routine X 2) w Reflex to ID Panel     Status: None (Preliminary result)   Collection Time: 02/21/20  6:00 AM   Specimen: BLOOD  Result Value Ref Range Status   Specimen Description   Final    BLOOD LEFT HAND Performed at [facility] [address], [place], [place] [address]    Special Requests   Final    BOTTLES DRAWN AEROBIC AND ANAEROBIC Blood  Culture adequate volume Performed at [facility], [address], [place], [place] [address]    Culture   Final    NO GROWTH < 12 HOURS Performed at [facility], [address] [place], [place] [address]    Report Status PENDING  Incomplete      Radiology Studies: CT Angio Chest PE W/Cm &/Or Wo Cm  Result Date: 02/20/2020 CLINICAL DATA:  Shortness of breath. COVID positive. Prior pulmonary embolus. EXAM: CT ANGIOGRAPHY CHEST WITH CONTRAST TECHNIQUE: Multidetector CT imaging of the chest was performed using the standard protocol during bolus administration of intravenous contrast. Multiplanar CT image reconstructions and MIPs were obtained to evaluate the vascular anatomy. CONTRAST:  OMNIPAQUE IOHEXOL 350 MG/ML SOLN COMPARISON:  Chest radiograph earlier this day. FINDINGS: Cardiovascular: Acute pulmonary emboli in the distal left pulmonary artery extending into the upper, lower, and lingular pulmonary arteries. Lower lobe thrombus may be partially occlusive at the basilar segment. There are small a centric filling defects within subsegmental right lower lobe pulmonary arteries that are age indeterminate. Overall thromboembolic burden is moderate. RV to LV  ratio is 1. Minimal contrast refluxes into the hepatic veins and IVC. Slight straightening of the intraventricular septum. Heart is normal in size. No pericardial effusion. Mediastinum/Nodes: Enlarged right hilar node measuring 17 mm. There is a prominent subcarinal node at 13 mm. Shotty left hilar and additional mediastinal lymph nodes. No thyroid nodule. No esophageal wall thickening. Lungs/Pleura: Multifocal ground-glass opacities throughout both lungs, peripheral predominant, consistent with COVID-19 pneumonia. Linear atelectasis in the left lower lobe. Basilar evaluation is partially obscured by breathing motion artifact. Possible early pulmonary infarct at the left lung base. No significant pleural effusion. The trachea and mainstem bronchi are patent. Upper Abdomen: Minimal contrast flexing into hepatic veins and IVC. No other acute finding. Musculoskeletal: There are no acute or suspicious osseous abnormalities. Degenerative change in the thoracic and included cervical spine. Review of the MIP images confirms the above findings. IMPRESSION: 1. Positive for acute bilateral PE, left greater than right. Thromboembolic burden is moderate. There is CT evidence of right heart strain (RV/LV Ratio = 1) consistent with at least submassive (intermediate risk) PE. The presence of right heart strain has been associated with an increased risk of morbidity and mortality. 2. Multifocal ground-glass opacities throughout both lungs, consistent with COVID-19 pneumonia. Possible early pulmonary infarct at the left lung base. 3. Mediastinal and right hilar adenopathy is likely reactive. Critical Value/emergent results were called by telephone at the time of interpretation on 02/20/2020 at 10:22 pm to [facility] , who verbally acknowledged these results. Electronically Signed   By: Narda Rutherford M.D.   On: 02/20/2020 22:22   DG Chest Portable 1 View  Result Date: 02/20/2020 CLINICAL DATA:  Shortness of breath. COVID  positive 02/12/2020. Cough. EXAM: PORTABLE CHEST 1 VIEW COMPARISON:  None. FINDINGS: Low lung volumes. Normal heart size for technique. Patchy and streaky opacities in both lung bases. Vague opacity in the right suprahilar lung. No pleural effusion or pneumothorax. No acute osseous abnormalities are seen. IMPRESSION: Low lung volumes with patchy bilateral lung opacities, suspicious for multifocal pneumonia in the setting of COVID-19. There is likely an element of superimposed atelectasis at the left lung base. Electronically Signed   By: Narda Rutherford M.D.   On: 02/20/2020 20:43    Scheduled Meds: . albuterol  2 puff Inhalation Q6H  . vitamin C  500 mg Oral Daily  . dexamethasone (DECADRON) injection  6 mg Intravenous Q24H  . zinc sulfate  220 mg Oral Daily   Continuous Infusions: . heparin 1,150 Units/hr (02/21/20 0904)  . [START ON 02/22/2020] remdesivir 100 mg in NS 100 mL       LOS: 0 days   Time spent: 35 minutes.  Tyrone Nine, MD [facility] [email or web address] 02/21/2020, 2:12 PM

## 2020-02-22 DIAGNOSIS — I2699 Other pulmonary embolism without acute cor pulmonale: Secondary | ICD-10-CM

## 2020-02-22 LAB — C-REACTIVE PROTEIN: CRP: 4.6 mg/dL — ABNORMAL HIGH (ref <1.0)

## 2020-02-22 LAB — CBC
HCT: 41.5 % (ref 39.0–52.0)
Hemoglobin: 12.7 g/dL — ABNORMAL LOW (ref 13.0–17.0)
MCH: 22.8 pg — ABNORMAL LOW (ref 26.0–34.0)
MCHC: 30.6 g/dL (ref 30.0–36.0)
MCV: 74.5 fL — ABNORMAL LOW (ref 80.0–100.0)
Platelets: 161 10*3/uL (ref 150–400)
RBC: 5.57 MIL/uL (ref 4.22–5.81)
RDW: 14.2 % (ref 11.5–15.5)
WBC: 5.5 10*3/uL (ref 4.0–10.5)
nRBC: 0 % (ref 0.0–0.2)

## 2020-02-22 LAB — D-DIMER, QUANTITATIVE: D-Dimer, Quant: 1.07 ug/mL-FEU — ABNORMAL HIGH (ref 0.00–0.50)

## 2020-02-22 LAB — HEPARIN LEVEL (UNFRACTIONATED): Heparin Unfractionated: 0.49 IU/mL (ref 0.30–0.70)

## 2020-02-22 LAB — FERRITIN: Ferritin: 620 ng/mL — ABNORMAL HIGH (ref 24–336)

## 2020-02-22 MED ORDER — POLYETHYLENE GLYCOL 3350 17 G PO PACK
17.0000 g | PACK | Freq: Every day | ORAL | Status: DC
  Administered 2020-02-22: 17 g via ORAL
  Filled 2020-02-22: qty 1

## 2020-02-22 MED ORDER — RIVAROXABAN 15 MG PO TABS
15.0000 mg | ORAL_TABLET | Freq: Two times a day (BID) | ORAL | Status: DC
  Administered 2020-02-22: 15 mg via ORAL
  Filled 2020-02-22: qty 1

## 2020-02-22 MED ORDER — RIVAROXABAN 20 MG PO TABS: 20.0000 mg | ORAL_TABLET | Freq: Every day | ORAL | Status: DC

## 2020-02-22 MED ORDER — RIVAROXABAN (XARELTO) VTE STARTER PACK (15 & 20 MG): ORAL_TABLET | ORAL | 0 refills | Status: AC

## 2020-02-22 NOTE — Progress Notes (Signed)
ANTICOAGULATION CONSULT NOTE - Follow Up Consult  Pharmacy Consult for heparin Indication: acute pulmonary embolus  No Known Allergies  Patient Measurements: Height: 5\' 9"  (175.3 cm) Weight: 73.5 kg (162 lb) IBW/kg (Calculated) : 70.7 Heparin Dosing Weight: 73 kg  Vital Signs: Temp: 99.1 F (37.3 C) (05/26 2157) Temp Source: Oral (05/26 2157) BP: 104/59 (05/26 2157) Pulse Rate: 57 (05/26 2157)  Labs: Recent Labs    02/20/20 2015 02/20/20 2015 02/20/20 2035 02/20/20 2220 02/21/20 0143 02/21/20 0156 02/21/20 0600 02/21/20 0608 02/21/20 1512 02/21/20 2215  HGB 13.7  --   --   --   --  13.1  --   --   --   --   HCT 44.9  --   --   --   --  42.1  --   --   --   --   PLT 142*  --   --   --   --  143*  --   --   --   --   APTT  --   --  33  --   --   --   --   --   --   --   LABPROT  --   --  13.8  --   --   --   --   --   --   --   INR  --   --  1.1  --   --   --   --   --   --   --   HEPARINUNFRC  --   --   --   --   --   --  1.03*  --  0.88* 0.57  CREATININE 0.80  --   --   --   --   --  0.78  --   --   --   TROPONINIHS 5   < >  --  5 5  --   --  6  --   --    < > = values in this interval not displayed.    Estimated Creatinine Clearance: 100.6 mL/min (by C-G formula based on SCr of 0.78 mg/dL).  Assessment: Patient is a 59 y.o with hx PE two years ago (currently not on anticoag. PTA), recently diagnosed with COVID (02/12/20) presented to the ED on 5/25 with c/o SOB and chest pain. Chest CTA showed acute bilateral PE with evidence of right hear strain.  He's currently on heparin drip for acute VTE.  Today, 02/22/2020 12:21 AM: - HL at goal - cbc stable - No bleeding or infusion related issues reported by RN  Goal of Therapy:  Heparin level 0.3-0.7 units/ml Monitor platelets by anticoagulation protocol: Yes   Plan:  - Continue heparin infusion at 950 units/hr - Daily HL/CBC - Noted plan to transition to Xarelto prior to discharge  02/24/2020 D PharmD,  BCPS 02/22/2020,12:19 AM

## 2020-02-22 NOTE — Discharge Instructions (Addendum)
You are scheduled for an outpatient infusion of Remdesivir at 10:00 AM on Friday 5/28 and Saturday 5/29.  Please report to [name] at [address]  Drive to the security guard and tell them you are here for an infusion. They will direct you to the front entrance where we will come and get you.  For questions call [phone].  Thanks             Information on my medicine - XARELTO (rivaroxaban)  This medication education was reviewed with me or my healthcare representative as part of my discharge preparation.    WHY WAS XARELTO PRESCRIBED FOR YOU? Xarelto was prescribed to treat blood clots that may have been found in the veins of your legs (deep vein thrombosis) or in your lungs (pulmonary embolism) and to reduce the risk of them occurring again.  What do you need to know about Xarelto? The starting dose is one 15 mg tablet taken TWICE daily with food for the FIRST 21 DAYS then on 03/14/20  the dose is changed to one 20 mg tablet taken ONCE A DAY with your evening meal.  DO NOT stop taking Xarelto without talking to the health care provider who prescribed the medication.  Refill your prescription for 20 mg tablets before you run out.  After discharge, you should have regular check-up appointments with your healthcare provider that is prescribing your Xarelto.  In the future your dose may need to be changed if your kidney function changes by a significant amount.  What do you do if you miss a dose? If you are taking Xarelto TWICE DAILY and you miss a dose, take it as soon as you remember. You may take two 15 mg tablets (total 30 mg) at the same time then resume your regularly scheduled 15 mg twice daily the next day.  If you are taking Xarelto ONCE DAILY and you miss a dose, take it as soon as you remember on the same day then continue your regularly scheduled once daily regimen the next day. Do not take two doses of Xarelto at the same time.   Important  Safety Information Xarelto is a blood thinner medicine that can cause bleeding. You should call your healthcare provider right away if you experience any of the following: ? Bleeding from an injury or your nose that does not stop. ? Unusual colored urine (red or dark brown) or unusual colored stools (red or black). ? Unusual bruising for unknown reasons. ? A serious fall or if you hit your head (even if there is no bleeding).  Some medicines may interact with Xarelto and might increase your risk of bleeding while on Xarelto. To help avoid this, consult your healthcare provider or pharmacist prior to using any new prescription or non-prescription medications, including herbals, vitamins, non-steroidal anti-inflammatory drugs (NSAIDs) and supplements.  This website has more information on Xarelto: VisitDestination.com.br.

## 2020-02-22 NOTE — Progress Notes (Signed)
Patient scheduled for outpatient Remdesivir infusion at 10AM on Friday 5/28, and Saturday 5/29.  Please advise them to report to [facility] at [address].  Drive to the security guard and tell them you are here for an infusion. They will direct you to the front entrance where we will come and get you.  For questions call [phone].  Thanks

## 2020-02-22 NOTE — Discharge Summary (Signed)
Physician Discharge Summary  Blondell RevealKeith Jones UEA:[id number] DOB: 08/24/1961 DOA: 02/20/2020  PCP: System, Pcp Not In  Admit date: 02/20/2020 Discharge date: 02/22/2020  Admitted From: Home Disposition: Home   Recommendations for Outpatient Follow-up:  1. Follow up with PCP in 1-2 weeks [facility], can't recall provider's name)  Home Health: None Equipment/Devices: None Discharge Condition: Stable CODE STATUS: Full Diet recommendation: As tolerated  Brief/Interim Summary: [name] is a 59 y.o. 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 CTA chest demonstrated diffuse ground glass opacities and moderate clot burden of bilateral PEs with RV: LV = 1 consistent with right heart strain. IV heparin was started and he was admitted late 5/25. LFTs have returned normal and remdesivir was started. Echocardiogram showed preserved global function, troponin has remained nonelevated. No bleeding noted and no hypoxemia, so heparin converted to xarelto (patient has used this in the past) and is stable to complete remdesivir as an outpatient (arranged prior to discharge).  Discharge Diagnoses:  Active Problems:   Bilateral pulmonary embolism (HCC)  Acute bilateral pulmonary emboli with right heart strain: Hemodynamically remaining stable.  - Echocardiogram reassuring, no right heart strain. - Convert to DOAC (has tolerated xarelto previously and wishes to use this at discharge), CM consulted.  Covid-19 pneumonia: SARS-CoV-2 PCR positive on 5/17. Leukopenic, thrombocytopenic with +GGOs on CTA chest. - Complete remdesivir x5 days (5/26 - 5/30) - Steroids given initially, though has remained without hypoxemia, would likely not benefit from continuing steroid - Isolation period is recommended for 21 days from positive testing due to  admission/pneumonia.  Microcytosis:  - Consider iron studies following acute illness.  Discharge Instructions Discharge Instructions    Discharge instructions   Complete by: As directed    You were treated for covid-19 pneumonia and pulmonary emboli.   Continue taking xarelto per directions. You will start with a higher dose (twice daily) and go to once daily dosing after a loading period, as you have done in the past. You will need to continue treatment for covid with [facility] [address] starting tomorrow at 10am and last dose Saturday at 10am. You do not require steroids because you are not showing low oxygen saturations.     Allergies as of 02/22/2020   No Known Allergies     Medication List    TAKE these medications   oxyCODONE-acetaminophen 7.5-325 MG tablet Commonly known as: PERCOCET Take 1 tablet by mouth 3 (three) times daily as needed for moderate pain.   Rivaroxaban Stater Pack (15 mg and 20 mg) Commonly known as: XARELTO STARTER PACK Follow package directions: Take one 15mg  tablet by mouth twice a day. On day 22, switch to one 20mg  tablet once a day. Take with food.       No Known Allergies  Consultations:  None  Procedures/Studies: CT Angio Chest PE W/Cm &/Or Wo Cm  Result Date: 02/20/2020 CLINICAL DATA:  Shortness of breath. COVID positive. Prior pulmonary embolus. EXAM: CT ANGIOGRAPHY CHEST WITH CONTRAST TECHNIQUE: Multidetector CT imaging of the chest was performed using the standard protocol during bolus administration of intravenous contrast. Multiplanar CT image reconstructions and MIPs were obtained to evaluate the vascular anatomy. CONTRAST:  100mL OMNIPAQUE IOHEXOL 350 MG/ML SOLN COMPARISON:  Chest radiograph earlier this day. FINDINGS: Cardiovascular: Acute pulmonary emboli in the distal left pulmonary artery  extending into the upper, lower, and lingular pulmonary arteries. Lower lobe thrombus may be  partially occlusive at the basilar segment. There are small a centric filling defects within subsegmental right lower lobe pulmonary arteries that are age indeterminate. Overall thromboembolic burden is moderate. RV to LV ratio is 1. Minimal contrast refluxes into the hepatic veins and IVC. Slight straightening of the intraventricular septum. Heart is normal in size. No pericardial effusion. Mediastinum/Nodes: Enlarged right hilar node measuring 17 mm. There is a prominent subcarinal node at 13 mm. Shotty left hilar and additional mediastinal lymph nodes. No thyroid nodule. No esophageal wall thickening. Lungs/Pleura: Multifocal ground-glass opacities throughout both lungs, peripheral predominant, consistent with COVID-19 pneumonia. Linear atelectasis in the left lower lobe. Basilar evaluation is partially obscured by breathing motion artifact. Possible early pulmonary infarct at the left lung base. No significant pleural effusion. The trachea and mainstem bronchi are patent. Upper Abdomen: Minimal contrast flexing into hepatic veins and IVC. No other acute finding. Musculoskeletal: There are no acute or suspicious osseous abnormalities. Degenerative change in the thoracic and included cervical spine. Review of the MIP images confirms the above findings. IMPRESSION: 1. Positive for acute bilateral PE, left greater than right. Thromboembolic burden is moderate. There is CT evidence of right heart strain (RV/LV Ratio = 1) consistent with at least submassive (intermediate risk) PE. The presence of right heart strain has been associated with an increased risk of morbidity and mortality. 2. Multifocal ground-glass opacities throughout both lungs, consistent with COVID-19 pneumonia. Possible early pulmonary infarct at the left lung base. 3. Mediastinal and right hilar adenopathy is likely reactive. Critical Value/emergent results were called by telephone at the time of interpretation on 02/20/2020 at 10:22 pm to [place] , who verbally acknowledged these results. Electronically Signed   By: Torion Rake M.D.   On: 02/20/2020 22:22   DG Chest Portable 1 View  Result Date: 02/20/2020 CLINICAL DATA:  Shortness of breath. COVID positive 02/12/2020. Cough. EXAM: PORTABLE CHEST 1 VIEW COMPARISON:  None. FINDINGS: Low lung volumes. Normal heart size for technique. Patchy and streaky opacities in both lung bases. Vague opacity in the right suprahilar lung. No pleural effusion or pneumothorax. No acute osseous abnormalities are seen. IMPRESSION: Low lung volumes with patchy bilateral lung opacities, suspicious for multifocal pneumonia in the setting of COVID-19. There is likely an element of superimposed atelectasis at the left lung base. Electronically Signed   By: Bekim Rake M.D.   On: 02/20/2020 20:43   ECHOCARDIOGRAM COMPLETE  Result Date: 02/21/2020    ECHOCARDIOGRAM REPORT   Patient Name:   [place] Jones Date of Exam: 02/21/2020 Medical Rec #:  [id number]       Height:       69.0 in Accession #:    [id number]      Weight:       162.0 lb Date of Birth:  [birth year]       BSA:          1.889 m Patient Age:    59 years        BP:           105/67 mmHg Patient Gender: M               HR:           60 bpm. Exam Location:  Inpatient Procedure: 2D Echo Indications:    pulmonary embolus  History:        Patient has no prior history of  Echocardiogram examinations.                 Covid +.  Sonographer:    Celene Skeen RDCS (AE) Referring Phys: [id number] ALEXIS HUGELMEYER  Sonographer Comments: limited mobility. supine exam IMPRESSIONS  1. Left ventricular ejection fraction, by estimation, is 65 to 70%. The left ventricle has normal function. The left ventricle has no regional wall motion abnormalities. Left ventricular diastolic parameters were normal.  2. Right ventricular systolic function is normal. The right ventricular size is mildly enlarged. Tricuspid regurgitation signal is inadequate for assessing PA pressure.   3. The mitral valve is grossly normal. No evidence of mitral valve regurgitation. No evidence of mitral stenosis.  4. The aortic valve is tricuspid. Aortic valve regurgitation is not visualized. No aortic stenosis is present.  5. The inferior vena cava is normal in size with <50% respiratory variability, suggesting right atrial pressure of 8 mmHg. FINDINGS  Left Ventricle: Left ventricular ejection fraction, by estimation, is 65 to 70%. The left ventricle has normal function. The left ventricle has no regional wall motion abnormalities. The left ventricular internal cavity size was normal in size. There is  no left ventricular hypertrophy. Left ventricular diastolic parameters were normal. Normal left ventricular filling pressure. Right Ventricle: The right ventricular size is mildly enlarged. No increase in right ventricular wall thickness. Right ventricular systolic function is normal. Tricuspid regurgitation signal is inadequate for assessing PA pressure. Left Atrium: Left atrial size was normal in size. Right Atrium: Right atrial size was normal in size. Pericardium: Trivial pericardial effusion is present. Mitral Valve: The mitral valve is grossly normal. No evidence of mitral valve regurgitation. No evidence of mitral valve stenosis. Tricuspid Valve: The tricuspid valve is grossly normal. Tricuspid valve regurgitation is trivial. No evidence of tricuspid stenosis. Aortic Valve: The aortic valve is tricuspid. Aortic valve regurgitation is not visualized. No aortic stenosis is present. Pulmonic Valve: The pulmonic valve was grossly normal. Pulmonic valve regurgitation is not visualized. No evidence of pulmonic stenosis. Aorta: The aortic root is normal in size and structure. Venous: The inferior vena cava is normal in size with less than 50% respiratory variability, suggesting right atrial pressure of 8 mmHg. IAS/Shunts: The atrial septum is grossly normal.  LEFT VENTRICLE PLAX 2D LVIDd:         4.50 cm   Diastology LVIDs:         2.80 cm  LV e' lateral:   12.80 cm/s LV PW:         1.20 cm  LV E/e' lateral: 5.8 LV IVS:        1.00 cm  LV e' medial:    12.60 cm/s LVOT diam:     2.10 cm  LV E/e' medial:  5.9 LV SV:         73 LV SV Index:   39 LVOT Area:     3.46 cm  RIGHT VENTRICLE RV S prime:     12.70 cm/s TAPSE (M-mode): 2.1 cm LEFT ATRIUM             Index       RIGHT ATRIUM           Index LA diam:        2.80 cm 1.48 cm/m  RA Area:     18.10 cm LA Vol (A2C):   49.9 ml 26.42 ml/m RA Volume:   46.60 ml  24.67 ml/m LA Vol (A4C):   24.7 ml 13.08 ml/m LA Biplane Vol:  35.5 ml 18.79 ml/m  AORTIC VALVE LVOT Vmax:   91.40 cm/s LVOT Vmean:  66.200 cm/s LVOT VTI:    0.211 m  AORTA Ao Root diam: 3.30 cm MITRAL VALVE MV Area (PHT): 3.31 cm    SHUNTS MV Decel Time: 229 msec    Systemic VTI:  0.21 m MV E velocity: 74.60 cm/s  Systemic Diam: 2.10 cm MV A velocity: 65.60 cm/s MV E/A ratio:  1.14 Lennie Odor MD Electronically signed by Lennie Odor MD Signature Date/Time: 02/21/2020/5:33:25 PM    Final        Subjective: Feels well, still with constipation but did have small BM this AM. No dyspnea, getting up to bathroom independently. No chest pain.   Discharge Exam: Vitals:   02/22/20 0826 02/22/20 1346  BP: (!) 97/59 115/72  Pulse: (!) 57 67  Resp:  18  Temp: 99 F (37.2 C) 99.1 F (37.3 C)  SpO2:  96%   General: Pt is alert, awake, not in acute distress Cardiovascular: RRR, S1/S2 +, no rubs, no gallops Respiratory: Nonlabored on room air with scant crackles bilaterally. Abdominal: Soft, NT, ND, bowel sounds + Extremities: No edema, no cyanosis  Labs: BNP (last 3 results) No results for input(s): BNP in the last 8760 hours. Basic Metabolic Panel: Recent Labs  Lab 02/20/20 2015 02/21/20 0143 02/21/20 0600  NA 137  --  136  K 3.7  --  3.9  CL 100  --  103  CO2 27  --  25  GLUCOSE 107*  --  124*  BUN 11  --  11  CREATININE 0.80  --  0.78  CALCIUM 8.4*  --  8.4*  MG  --  1.9  --    PHOS  --  1.9*  --    Liver Function Tests: Recent Labs  Lab 02/21/20 0143  AST 21  ALT 18  ALKPHOS 37*  BILITOT 0.8  PROT 6.0*  ALBUMIN 3.0*   No results for input(s): LIPASE, AMYLASE in the last 168 hours. No results for input(s): AMMONIA in the last 168 hours. CBC: Recent Labs  Lab 02/20/20 2015 02/21/20 0156 02/22/20 0409  WBC 3.5* 3.2* 5.5  NEUTROABS 2.5  --   --   HGB 13.7 13.1 12.7*  HCT 44.9 42.1 41.5  MCV 74.7* 74.6* 74.5*  PLT 142* 143* 161   Cardiac Enzymes: No results for input(s): CKTOTAL, CKMB, CKMBINDEX, TROPONINI in the last 168 hours. BNP: Invalid input(s): POCBNP CBG: No results for input(s): GLUCAP in the last 168 hours. D-Dimer Recent Labs    02/22/20 0409  DDIMER 1.07*   Hgb A1c No results for input(s): HGBA1C in the last 72 hours. Lipid Profile No results for input(s): CHOL, HDL, LDLCALC, TRIG, CHOLHDL, LDLDIRECT in the last 72 hours. Thyroid function studies No results for input(s): TSH, T4TOTAL, T3FREE, THYROIDAB in the last 72 hours.  Invalid input(s): FREET3 Anemia work up Recent Labs    02/21/20 1512 02/22/20 0409  FERRITIN 454* 620*   Urinalysis No results found for: COLORURINE, APPEARANCEUR, LABSPEC, PHURINE, GLUCOSEU, HGBUR, BILIRUBINUR, KETONESUR, PROTEINUR, UROBILINOGEN, NITRITE, LEUKOCYTESUR  Microbiology Recent Results (from the past 240 hour(s))  Culture, blood (Routine X 2) w Reflex to ID Panel     Status: None (Preliminary result)   Collection Time: 02/21/20  1:43 AM   Specimen: BLOOD  Result Value Ref Range Status   Specimen Description   Final    BLOOD LEFT ARM Performed at [facility], [address] [place], [place] [address]    Special Requests   Final    BOTTLES DRAWN AEROBIC AND ANAEROBIC Blood Culture adequate volume Performed at [facility], [address], [place], [place] [address]    Culture   Final    NO GROWTH 1 DAY Performed at [facility], [address], [place], [place] [address]    Report Status PENDING  Incomplete  Culture, blood (Routine X 2) w Reflex to ID Panel     Status: None (Preliminary result)   Collection Time: 02/21/20  6:00 AM   Specimen: BLOOD  Result Value Ref Range Status   Specimen Description   Final    BLOOD LEFT HAND Performed at [facility], [address], [place], [place] [address]    Special Requests   Final    BOTTLES DRAWN AEROBIC AND ANAEROBIC Blood Culture adequate volume Performed at [facility], [address] [place], [place] [address]    Culture   Final    NO GROWTH 1 DAY Performed at [facility], [address], [place], [place] [address]    Report Status PENDING  Incomplete    Time coordinating discharge: Approximately 40 minutes  Tyrone Nine, MD  [facility] 02/22/2020, 3:23 PM

## 2020-02-22 NOTE — Progress Notes (Signed)
ANTICOAGULATION CONSULT NOTE - Follow Up Consult  Pharmacy Consult for heparin--> xarelto Indication: acute pulmonary embolus  No Known Allergies  Patient Measurements: Height: 5\' 9"  (175.3 cm) Weight: 73.5 kg (162 lb) IBW/kg (Calculated) : 70.7 Heparin Dosing Weight: 73 kg  Vital Signs: Temp: 100.7 F (38.2 C) (05/27 0600) Temp Source: Oral (05/27 0600) BP: 110/57 (05/27 0616) Pulse Rate: 56 (05/27 0616)  Labs: Recent Labs    02/20/20 2015 02/20/20 2015 02/20/20 2035 02/20/20 2220 02/21/20 0143 02/21/20 0156 02/21/20 0600 02/21/20 0600 02/21/20 0608 02/21/20 1512 02/21/20 2215 02/22/20 0409  HGB 13.7   < >  --   --   --  13.1  --   --   --   --   --  12.7*  HCT 44.9  --   --   --   --  42.1  --   --   --   --   --  41.5  PLT 142*  --   --   --   --  143*  --   --   --   --   --  161  APTT  --   --  33  --   --   --   --   --   --   --   --   --   LABPROT  --   --  13.8  --   --   --   --   --   --   --   --   --   INR  --   --  1.1  --   --   --   --   --   --   --   --   --   HEPARINUNFRC  --   --   --   --   --   --  1.03*   < >  --  0.88* 0.57 0.49  CREATININE 0.80  --   --   --   --   --  0.78  --   --   --   --   --   TROPONINIHS 5   < >  --  5 5  --   --   --  6  --   --   --    < > = values in this interval not displayed.    Estimated Creatinine Clearance: 100.6 mL/min (by C-G formula based on SCr of 0.78 mg/dL).  Assessment: Patient is a 59 y.o with hx PE two years ago (currently not on anticoag. PTA), recently diagnosed with COVID (02/12/20) presented to the ED on 5/25 with c/o SOB and chest pain. Chest CTA showed acute bilateral PE with evidence of right hear strain.  He's currently on heparin drip for acute VTE.  Pharmacy was consulted to transition to xarelto on 5/27.  Today, 02/22/2020: - heparin level is therapeutic at 0.49 - cbc relatively stable - no bleeding documented - crcl~100  Goal of Therapy:  Heparin level 0.3-0.7 units/ml Monitor  platelets by anticoagulation protocol: Yes   Plan:  - d/c heparin drip  - start xarelto 15 mg bid x21 days, then xarelto 20 mg daily - monitor for s/s bleeding  Pham, Anh P 02/22/2020,8:22 AM

## 2020-02-23 ENCOUNTER — Ambulatory Visit (HOSPITAL_COMMUNITY)
Admit: 2020-02-23 | Discharge: 2020-02-23 | Disposition: A | Discharge status: Home / Self Care | Payer: Medicaid Other | Source: Ambulatory Visit | Attending: Pulmonary Disease | Admitting: Pulmonary Disease

## 2020-02-23 DIAGNOSIS — U071 COVID-19: Secondary | ICD-10-CM | POA: Diagnosis not present

## 2020-02-23 MED ORDER — EPINEPHRINE 0.3 MG/0.3ML IJ SOAJ: 0.3000 mg | Freq: Once | INTRAMUSCULAR | Status: DC | PRN

## 2020-02-23 MED ORDER — METHYLPREDNISOLONE SODIUM SUCC 125 MG IJ SOLR: 125.0000 mg | Freq: Once | INTRAMUSCULAR | Status: DC | PRN

## 2020-02-23 MED ORDER — ALBUTEROL SULFATE HFA 108 (90 BASE) MCG/ACT IN AERS: 2.0000 | INHALATION_SPRAY | Freq: Once | RESPIRATORY_TRACT | Status: DC | PRN

## 2020-02-23 MED ORDER — DIPHENHYDRAMINE HCL 50 MG/ML IJ SOLN: 50.0000 mg | Freq: Once | INTRAMUSCULAR | Status: DC | PRN

## 2020-02-23 MED ORDER — FAMOTIDINE IN NACL 20-0.9 MG/50ML-% IV SOLN: 20.0000 mg | Freq: Once | INTRAVENOUS | Status: DC | PRN

## 2020-02-23 MED ORDER — SODIUM CHLORIDE 0.9 % IV SOLN: INTRAVENOUS | Status: DC | PRN

## 2020-02-23 MED ORDER — SODIUM CHLORIDE 0.9 % IV SOLN
100.0000 mg | Freq: Once | INTRAVENOUS | Status: AC
  Administered 2020-02-23: 100 mg via INTRAVENOUS
  Filled 2020-02-23: qty 20

## 2020-02-23 NOTE — Progress Notes (Signed)
  Diagnosis: COVID-19  Physician: Dr. Delford Field  Procedure: [facility] Med: remdesivir infusion - Provided patient with remdesivir fact sheet for patients, parents and caregivers prior to infusion.  Complications: No immediate complications noted.  Discharge: Discharged home   [place] Jones 02/23/2020

## 2020-02-23 NOTE — Discharge Instructions (Signed)

## 2020-02-24 ENCOUNTER — Ambulatory Visit (HOSPITAL_COMMUNITY)
Admit: 2020-02-24 | Discharge: 2020-02-24 | Disposition: A | Discharge status: Home / Self Care | Payer: Medicaid Other | Attending: Pulmonary Disease | Admitting: Pulmonary Disease

## 2020-02-24 MED ORDER — FAMOTIDINE IN NACL 20-0.9 MG/50ML-% IV SOLN: 20.0000 mg | Freq: Once | INTRAVENOUS | Status: DC | PRN

## 2020-02-24 MED ORDER — ALBUTEROL SULFATE HFA 108 (90 BASE) MCG/ACT IN AERS: 2.0000 | INHALATION_SPRAY | Freq: Once | RESPIRATORY_TRACT | Status: DC | PRN

## 2020-02-24 MED ORDER — SODIUM CHLORIDE 0.9 % IV SOLN: INTRAVENOUS | Status: DC | PRN

## 2020-02-24 MED ORDER — SODIUM CHLORIDE 0.9 % IV SOLN: 100.0000 mg | Freq: Once | INTRAVENOUS | Status: DC

## 2020-02-24 MED ORDER — ACETAMINOPHEN 325 MG PO TABS: 650.0000 mg | ORAL_TABLET | Freq: Once | ORAL | Status: DC

## 2020-02-24 MED ORDER — EPINEPHRINE 0.3 MG/0.3ML IJ SOAJ: 0.3000 mg | Freq: Once | INTRAMUSCULAR | Status: DC | PRN

## 2020-02-24 MED ORDER — DIPHENHYDRAMINE HCL 50 MG/ML IJ SOLN: 50.0000 mg | Freq: Once | INTRAMUSCULAR | Status: DC | PRN

## 2020-02-24 MED ORDER — METHYLPREDNISOLONE SODIUM SUCC 125 MG IJ SOLR: 125.0000 mg | Freq: Once | INTRAMUSCULAR | Status: DC | PRN

## 2020-02-24 NOTE — Progress Notes (Signed)
Patient went home , refused to take his last dose of Remdesivir. Patient states "this medicine making him feel worse"

## 2020-02-26 LAB — CULTURE, BLOOD (ROUTINE X 2)
Culture: NO GROWTH
Culture: NO GROWTH
Special Requests: ADEQUATE
Special Requests: ADEQUATE

## 2021-02-20 ENCOUNTER — Observation Stay (HOSPITAL_COMMUNITY)
Admission: EM | Admit: 2021-02-20 | Discharge: 2021-02-21 | Disposition: A | Discharge status: Home / Self Care | Payer: Medicaid Other | Attending: Family Medicine | Admitting: Family Medicine

## 2021-02-20 ENCOUNTER — Emergency Department (HOSPITAL_COMMUNITY): Payer: Medicaid Other

## 2021-02-20 ENCOUNTER — Encounter (HOSPITAL_COMMUNITY): Payer: Self-pay | Admitting: Emergency Medicine

## 2021-02-20 ENCOUNTER — Other Ambulatory Visit: Payer: Self-pay

## 2021-02-20 DIAGNOSIS — E872 Acidosis, unspecified: Secondary | ICD-10-CM | POA: Diagnosis present

## 2021-02-20 DIAGNOSIS — T40601A Poisoning by unspecified narcotics, accidental (unintentional), initial encounter: Secondary | ICD-10-CM

## 2021-02-20 DIAGNOSIS — Y900 Blood alcohol level of less than 20 mg/100 ml: Secondary | ICD-10-CM | POA: Insufficient documentation

## 2021-02-20 DIAGNOSIS — Z8616 Personal history of COVID-19: Secondary | ICD-10-CM | POA: Diagnosis not present

## 2021-02-20 DIAGNOSIS — Z7901 Long term (current) use of anticoagulants: Secondary | ICD-10-CM | POA: Diagnosis not present

## 2021-02-20 DIAGNOSIS — G9340 Encephalopathy, unspecified: Principal | ICD-10-CM | POA: Insufficient documentation

## 2021-02-20 DIAGNOSIS — N179 Acute kidney failure, unspecified: Secondary | ICD-10-CM | POA: Diagnosis not present

## 2021-02-20 DIAGNOSIS — E860 Dehydration: Secondary | ICD-10-CM | POA: Diagnosis not present

## 2021-02-20 DIAGNOSIS — Z20822 Contact with and (suspected) exposure to covid-19: Secondary | ICD-10-CM | POA: Insufficient documentation

## 2021-02-20 DIAGNOSIS — T402X1A Poisoning by other opioids, accidental (unintentional), initial encounter: Secondary | ICD-10-CM | POA: Insufficient documentation

## 2021-02-20 DIAGNOSIS — D72829 Elevated white blood cell count, unspecified: Secondary | ICD-10-CM | POA: Diagnosis not present

## 2021-02-20 DIAGNOSIS — F1410 Cocaine abuse, uncomplicated: Secondary | ICD-10-CM | POA: Diagnosis present

## 2021-02-20 DIAGNOSIS — I959 Hypotension, unspecified: Secondary | ICD-10-CM | POA: Diagnosis not present

## 2021-02-20 DIAGNOSIS — T50901A Poisoning by unspecified drugs, medicaments and biological substances, accidental (unintentional), initial encounter: Secondary | ICD-10-CM

## 2021-02-20 DIAGNOSIS — R4182 Altered mental status, unspecified: Secondary | ICD-10-CM | POA: Diagnosis present

## 2021-02-20 HISTORY — DX: COVID-19: U07.1

## 2021-02-20 HISTORY — DX: Chronic pain syndrome: G89.4

## 2021-02-20 LAB — SALICYLATE LEVEL: Salicylate Lvl: <7 mg/dL — ABNORMAL LOW (ref 7.0–30.0)

## 2021-02-20 LAB — CBC WITH DIFFERENTIAL/PLATELET
Abs Immature Granulocytes: 0.5 10*3/uL — ABNORMAL HIGH (ref 0.00–0.07)
Basophils Absolute: 0 10*3/uL (ref 0.0–0.1)
Basophils Relative: 0 %
Eosinophils Absolute: 0 10*3/uL (ref 0.0–0.5)
Eosinophils Relative: 0 %
HCT: 48.3 % (ref 39.0–52.0)
Hemoglobin: 14.3 g/dL (ref 13.0–17.0)
Immature Granulocytes: 3 %
Lymphocytes Relative: 4 %
Lymphs Abs: 0.6 10*3/uL — ABNORMAL LOW (ref 0.7–4.0)
MCH: 23.4 pg — ABNORMAL LOW (ref 26.0–34.0)
MCHC: 29.6 g/dL — ABNORMAL LOW (ref 30.0–36.0)
MCV: 79.1 fL — ABNORMAL LOW (ref 80.0–100.0)
Monocytes Absolute: 1 10*3/uL (ref 0.1–1.0)
Monocytes Relative: 7 %
Neutro Abs: 12.9 10*3/uL — ABNORMAL HIGH (ref 1.7–7.7)
Neutrophils Relative %: 86 %
Platelets: 233 10*3/uL (ref 150–400)
RBC: 6.11 MIL/uL — ABNORMAL HIGH (ref 4.22–5.81)
RDW: 15.8 % — ABNORMAL HIGH (ref 11.5–15.5)
WBC: 15 10*3/uL — ABNORMAL HIGH (ref 4.0–10.5)
nRBC: 0 % (ref 0.0–0.2)

## 2021-02-20 LAB — I-STAT VENOUS BLOOD GAS, ED
Acid-base deficit: 3 mmol/L — ABNORMAL HIGH (ref 0.0–2.0)
Acid-base deficit: 3 mmol/L — ABNORMAL HIGH (ref 0.0–2.0)
Bicarbonate: 24.2 mmol/L (ref 20.0–28.0)
Bicarbonate: 25 mmol/L (ref 20.0–28.0)
Calcium, Ion: 1.02 mmol/L — ABNORMAL LOW (ref 1.15–1.40)
Calcium, Ion: 1.06 mmol/L — ABNORMAL LOW (ref 1.15–1.40)
HCT: 45 % (ref 39.0–52.0)
HCT: 43 % (ref 39.0–52.0)
Hemoglobin: 15.3 g/dL (ref 13.0–17.0)
Hemoglobin: 14.6 g/dL (ref 13.0–17.0)
O2 Saturation: 38 %
O2 Saturation: 28 %
Potassium: 4.6 mmol/L (ref 3.5–5.1)
Potassium: 4.2 mmol/L (ref 3.5–5.1)
Sodium: 140 mmol/L (ref 135–145)
Sodium: 142 mmol/L (ref 135–145)
TCO2: 26 mmol/L (ref 22–32)
TCO2: 27 mmol/L (ref 22–32)
pCO2, Ven: 52.1 mmHg (ref 44.0–60.0)
pCO2, Ven: 54.2 mmHg (ref 44.0–60.0)
pH, Ven: 7.275 (ref 7.250–7.430)
pH, Ven: 7.272 (ref 7.250–7.430)
pO2, Ven: 25 mmHg — CL (ref 32.0–45.0)
pO2, Ven: 21 mmHg — CL (ref 32.0–45.0)

## 2021-02-20 LAB — ETHANOL: Alcohol, Ethyl (B): <10 mg/dL (ref <10)

## 2021-02-20 LAB — RAPID URINE DRUG SCREEN, HOSP PERFORMED
Amphetamines: NOT DETECTED
Barbiturates: NOT DETECTED
Benzodiazepines: NOT DETECTED
Cocaine: POSITIVE — AB
Opiates: POSITIVE — AB
Tetrahydrocannabinol: NOT DETECTED

## 2021-02-20 LAB — RESP PANEL BY RT-PCR (FLU A&B, COVID) ARPGX2
Influenza A by PCR: NEGATIVE
Influenza B by PCR: NEGATIVE
SARS Coronavirus 2 by RT PCR: NEGATIVE

## 2021-02-20 LAB — BASIC METABOLIC PANEL
Anion gap: 8 (ref 5–15)
BUN: 12 mg/dL (ref 6–20)
CO2: 23 mmol/L (ref 22–32)
Calcium: 7.6 mg/dL — ABNORMAL LOW (ref 8.9–10.3)
Chloride: 107 mmol/L (ref 98–111)
Creatinine, Ser: 1.47 mg/dL — ABNORMAL HIGH (ref 0.61–1.24)
GFR, Estimated: 55 mL/min — ABNORMAL LOW (ref >60)
Glucose, Bld: 110 mg/dL — ABNORMAL HIGH (ref 70–99)
Potassium: 4.9 mmol/L (ref 3.5–5.1)
Sodium: 138 mmol/L (ref 135–145)

## 2021-02-20 LAB — ACETAMINOPHEN LEVEL: Acetaminophen (Tylenol), Serum: <10 ug/mL — ABNORMAL LOW (ref 10–30)

## 2021-02-20 LAB — LACTIC ACID, PLASMA
Lactic Acid, Venous: 3.4 mmol/L (ref 0.5–1.9)
Lactic Acid, Venous: 2.8 mmol/L (ref 0.5–1.9)
Lactic Acid, Venous: 2.4 mmol/L (ref 0.5–1.9)

## 2021-02-20 MED ORDER — NALOXONE HCL 0.4 MG/ML IJ SOLN: 0.4000 mg | INTRAMUSCULAR | Status: DC | PRN

## 2021-02-20 MED ORDER — ACETAMINOPHEN 650 MG RE SUPP: 650.0000 mg | Freq: Four times a day (QID) | RECTAL | Status: DC | PRN

## 2021-02-20 MED ORDER — ACETAMINOPHEN 325 MG PO TABS: 650.0000 mg | ORAL_TABLET | Freq: Four times a day (QID) | ORAL | Status: DC | PRN

## 2021-02-20 MED ORDER — NALOXONE HCL 0.4 MG/ML IJ SOLN
0.4000 mg | Freq: Once | INTRAMUSCULAR | Status: AC
  Administered 2021-02-20: 0.4 mg via INTRAVENOUS
  Filled 2021-02-20: qty 1

## 2021-02-20 MED ORDER — SODIUM CHLORIDE 0.9 % IV BOLUS
1000.0000 mL | Freq: Once | INTRAVENOUS | Status: AC
  Administered 2021-02-20: 1000 mL via INTRAVENOUS

## 2021-02-20 MED ORDER — RIVAROXABAN (XARELTO) VTE STARTER PACK (15 & 20 MG): 10.0000 mg | ORAL_TABLET | Freq: Every day | ORAL | Status: DC

## 2021-02-20 MED ORDER — LACTATED RINGERS IV SOLN: INTRAVENOUS | Status: DC

## 2021-02-20 MED ORDER — NALOXONE HCL 0.4 MG/ML IJ SOLN: 0.4000 mg | Freq: Once | INTRAMUSCULAR | Status: DC

## 2021-02-20 MED ORDER — ONDANSETRON HCL 4 MG/2ML IJ SOLN
4.0000 mg | Freq: Once | INTRAMUSCULAR | Status: AC
  Administered 2021-02-20: 4 mg via INTRAVENOUS
  Filled 2021-02-20: qty 2

## 2021-02-20 MED ORDER — LACTATED RINGERS IV BOLUS
1000.0000 mL | Freq: Once | INTRAVENOUS | Status: AC
  Administered 2021-02-20: 1000 mL via INTRAVENOUS

## 2021-02-20 MED ORDER — RIVAROXABAN 10 MG PO TABS
10.0000 mg | ORAL_TABLET | Freq: Every day | ORAL | Status: DC
  Administered 2021-02-20: 10 mg via ORAL
  Filled 2021-02-20 (×2): qty 1

## 2021-02-20 NOTE — ED Notes (Signed)
Pt placed on CO2 monitoring. Will continue to monitor. Pt becoming more arousable. Pt reports he is feeling better but keeps repeating "cold."

## 2021-02-20 NOTE — Progress Notes (Signed)
BIPAP on standby at this time. 

## 2021-02-20 NOTE — ED Triage Notes (Signed)
Per ems, pt coming from [facility]. Bystanders on scene reports pt was "popping Percocets like they were tic-tacs. Pt given 16mg  of nasal spray Narcan before ems arrival. Pinpoint pupils with ems upon arrival, alert, spontaneous respirations, not following commands, just grunting. Incontinent on arrival. 18g L AC, NS.EMS  BP 122/98, HR 80, 96% room air, cbg 146. Denies pain. Pt reports taking oxycodone and percocet. Denies etoh.

## 2021-02-20 NOTE — ED Provider Notes (Signed)
[facility] EMERGENCY DEPARTMENT Provider Note   CSN: [id number] Arrival date & time: 02/20/21  1126     History Chief Complaint  Patient presents with  . Drug Overdose    Brandon Jones is a 60 y.o. male.  HPI  Patient is a 60 year old male with past medical history significant for pulmonary embolism.  Patient brought in by EMS.  Per their report patient was had rigors and outside of establishment taking numerous Percocets and an seemed to become altered.  Was found by bystanders to be not breathing appropriately 16 mg of intranasal Narcan was administered.  The 2-4 mg intranasal atomizer's.  This was administered by bystanders.  By the time EMS arrived patient was somnolent but breathing.  Was transported by EMS given 250 of normal saline and no antiemetics.  Notably patient was incontinent at scene.  No seizure-like activity was reported.   At the time my evaluation 11:40 AM patient is still altered.  Level 5 caveat due to altered mental status.  However he is following commands and is able to tell me his name.     History reviewed. No pertinent past medical history.  Patient Active Problem List   Diagnosis Date Noted  . Bilateral pulmonary embolism (HCC) 02/21/2020    History reviewed. No pertinent surgical history.     No family history on file.  Social History   Tobacco Use  . Smoking status: Never Smoker  . Smokeless tobacco: Never Used  Substance Use Topics  . Alcohol use: Yes    Comment: casual  . Drug use: Yes    Comment: pt reports oxy and percocets    Home Medications Prior to Admission medications   Medication Sig Start Date End Date Taking? Authorizing Provider  oxyCODONE-acetaminophen (PERCOCET) 7.5-325 MG tablet Take 1 tablet by mouth 3 (three) times daily as needed for moderate pain.  02/11/20  Yes [provider]  RIVAROXABAN Carlena Hurl) VTE STARTER PACK (15 & 20 MG TABLETS) Follow package directions: Take one 15mg   tablet by mouth twice a day. On day 22, switch to one 20mg  tablet once a day. Take with food. Patient taking differently: Take 10 mg by mouth daily with supper. 02/22/20  Yes , MD    Allergies    Patient has no known allergies.  Review of Systems   Review of Systems  Unable to perform ROS: Mental status change  AMS likely 2/2 opioid intoxication   Physical Exam Updated Vital Signs BP (!) 99/58   Pulse 71   Temp 97.8 F (36.6 C) (Oral)   Resp 14   Ht 5\' 9"  (1.753 m)   Wt 77.1 kg   SpO2 99%   BMI 25.10 kg/m   Physical Exam Vitals and nursing note reviewed.  Constitutional:      General: He is not in acute distress.    Comments: Patient is somnolent but arousable.   HENT:     Head: Normocephalic and atraumatic.     Nose: Nose normal.  Eyes:     General: No scleral icterus. Cardiovascular:     Rate and Rhythm: Normal rate and regular rhythm.     Pulses: Normal pulses.     Heart sounds: Normal heart sounds.  Pulmonary:     Effort: Pulmonary effort is normal. No respiratory distress.     Breath sounds: No wheezing.  Abdominal:     Palpations: Abdomen is soft.     Tenderness: There is no abdominal tenderness.  Musculoskeletal:  Cervical back: Normal range of motion.     Right lower leg: No edema.     Left lower leg: No edema.  Skin:    General: Skin is warm and dry.     Capillary Refill: Capillary refill takes less than 2 seconds.  Neurological:     Mental Status: He is alert.     Comments: Patient is alert but unable to investigate full orientation given unwilling to speak. When partnered with patient was willing to say his name and exchange for a warm blanket. Moves all 4 extremities.  Good bilateral grip strength.  No facial droop.  Extraocular movements intact.   Psychiatric:     Comments:  Responds to nailbed pressure with good withdrawal of hand.  Patient is shivering slightly.  Offered warm blanket and patient accepted.  Was able to tell  examiner's name. To answer any other questions at this time.     ED Results / Procedures / Treatments   Labs (all labs ordered are listed, but only abnormal results are displayed) Labs Reviewed  ACETAMINOPHEN LEVEL - Abnormal; Notable for the following components:      Result Value   Acetaminophen (Tylenol), Serum <10 (*)    All other components within normal limits  SALICYLATE LEVEL - Abnormal; Notable for the following components:   Salicylate Lvl <7.0 (*)    All other components within normal limits  RAPID URINE DRUG SCREEN, HOSP PERFORMED - Abnormal; Notable for the following components:   Opiates POSITIVE (*)    Cocaine POSITIVE (*)    All other components within normal limits  CBC WITH DIFFERENTIAL/PLATELET - Abnormal; Notable for the following components:   WBC 15.0 (*)    RBC 6.11 (*)    MCV 79.1 (*)    MCH 23.4 (*)    MCHC 29.6 (*)    RDW 15.8 (*)    Neutro Abs 12.9 (*)    Lymphs Abs 0.6 (*)    Abs Immature Granulocytes 0.50 (*)    All other components within normal limits  LACTIC ACID, PLASMA - Abnormal; Notable for the following components:   Lactic Acid, Venous 3.4 (*)    All other components within normal limits  BASIC METABOLIC PANEL - Abnormal; Notable for the following components:   Glucose, Bld 110 (*)    Creatinine, Ser 1.47 (*)    Calcium 7.6 (*)    GFR, Estimated 55 (*)    All other components within normal limits  LACTIC ACID, PLASMA - Abnormal; Notable for the following components:   Lactic Acid, Venous 2.8 (*)    All other components within normal limits  I-STAT VENOUS BLOOD GAS, ED - Abnormal; Notable for the following components:   pO2, Ven 25.0 (*)    Acid-base deficit 3.0 (*)    Calcium, Ion 1.02 (*)    All other components within normal limits  I-STAT VENOUS BLOOD GAS, ED - Abnormal; Notable for the following components:   pO2, Ven 21.0 (*)    Acid-base deficit 3.0 (*)    Calcium, Ion 1.06 (*)    All other components within normal limits   ETHANOL    EKG EKG Interpretation  Date/Time:  Thursday Feb 20 2021 11:46:10 EDT Ventricular Rate:  89 PR Interval:  172 QRS Duration: 93 QT Interval:  377 QTC Calculation: 459 R Axis:   13 Text Interpretation: Sinus rhythm Probable left atrial enlargement RSR' in V1 or V2, probably normal variant No acute changes No significant change since last  tracing Confirmed by Derwood Kaplananavati, Ankit [phone]([id number]) on 02/20/2021 1:02:37 PM   Radiology DG Chest Portable 1 View  Result Date: 02/20/2021 CLINICAL DATA:  Shortness of breath EXAM: PORTABLE CHEST 1 VIEW COMPARISON:  02/20/2020 FINDINGS: No focal consolidation or effusion. Streaky atelectasis left base. Stable cardiomediastinal silhouette. No pneumothorax. IMPRESSION: Streaky atelectasis at the left lung base. Electronically Signed   By: Jasmine PangKim  Fujinaga M.D.   On: 02/20/2021 17:15    Procedures .Critical Care Performed by: Gailen ShelterFondaw, Moroni Nester S, PA Authorized by: Gailen ShelterFondaw, Farren Nelles S, PA   Critical care provider statement:    Critical care time (minutes):  35   Critical care time was exclusive of:  Separately billable procedures and treating other patients and teaching time   Critical care was necessary to treat or prevent imminent or life-threatening deterioration of the following conditions: Opioid overdose requiring multiple doses of Narcan and admission.   Critical care was time spent personally by me on the following activities:  Discussions with consultants, evaluation of patient's response to treatment, examination of patient, review of old charts, re-evaluation of patient's condition, pulse oximetry, ordering and review of radiographic studies, ordering and review of laboratory studies and ordering and performing treatments and interventions   I assumed direction of critical care for this patient from another provider in my specialty: no       Medications Ordered in ED Medications  ondansetron (ZOFRAN) injection 4 mg (4 mg Intravenous Given  02/20/21 1206)  sodium chloride 0.9 % bolus 1,000 mL (0 mLs Intravenous Stopped 02/20/21 1348)  naloxone (NARCAN) injection 0.4 mg (0.4 mg Intravenous Given 02/20/21 1438)  sodium chloride 0.9 % bolus 1,000 mL (0 mLs Intravenous Stopped 02/20/21 1600)  naloxone (NARCAN) injection 0.4 mg (0.4 mg Intravenous Given 02/20/21 1716)    ED Course  I have reviewed the triage vital signs and the nursing notes.  Pertinent labs & imaging results that were available during my care of the patient were reviewed by me and considered in my medical decision making (see chart for details).  Patient brought in by EMS per bystanders took handfuls of some pills.  Then seemed to lay down and per bystanders was not breathing he was administered multiple doses of intranasal Narcan and began breathing again however when brought in by EMS his still quite somnolent after 1 hour of observation given 1 dose of Narcan with significant improvement.  Patient is now able to answer questions and provide additional history.  He states that he had some Percocet that he was prescribed for knee pain he states that he takes these regularly.  Initially was unable to tell me where he got these pills but later told me that they were prescribed to him.  I did review PDMP and see that Pa Harriett SineOhe is a prescriber who has been regularly prescribing him 7.5 mg OxyContin.  I am unable to see any charts in the patient's EMR that explain this prescription.  He states it is for knee pain.  He states that he thinks that somebody has switched his pills of something else.  He believes that some to get on fentanyl.  He denies blindness off the street.  He denies any recreational drugs such as cocaine or amphetamines however his urine drug screen Positive for cocaine.  BMP notable for a mild elevation of creatinine from prior.  Patient states no decreased urine and no lower extremity swelling have low suspicion for notable kidney damage.  Will provide  fluids.  CBC with leukocytosis and  erythrocytosis consistent with dehydration a because of mild elevation in creatinine however BUN is within normal limits.  Ethanol salicylate Tylenol undetectable.  Initial lactic acid elevated but trended towards improvement with IV fluid boluses.  Urine drug screen positive for opioids and cocaine.  I-STAT VBG with pH approximately within normal limits somewhat on the low side consistent with respiratory depression although patient does not have a respiratory acidosis.  Repeat VBG after Narcan and patient is improved respiratory effort is unchanged.  Clinical Course as of 02/20/21 1856  Thu Feb 20, 2021  1201 EKG reviewed.  Normal sinus rhythm, no significant axis deviation.  Some evidence of early LVH/LAE.  No acute ST-T wave abnormalities. [WF]  1237 Patient has been here for approximately an hour and 10 minutes.  It has been approximately 90 min since narcan was given and he seems to have unchanged mental status but notably dropped to the high 80s and was placed on 2 L nasal cannula.  Will provide patient with 0.4 mg Narcan and reassess also provided IV fluids. [WF]    Clinical Course User Index [WF] Gailen Shelter, [place]   Patient has been observed here for 8 hours in the ER.  Ultimately seems to have persistent mild hypotension which is improved with fluid boluses.  He also has had some episodes of hypoxia.  This is been due to respiratory depression.  He has responded well to Narcan however he is high risk for adverse outcome.  Will admit to medicine.  MDM Rules/Calculators/A&P                          Final Clinical Impression(s) / ED Diagnoses Final diagnoses:  Accidental drug overdose, initial encounter  Opiate overdose, accidental or unintentional, initial encounter [facility])    Rx / DC Orders ED Discharge Orders    None       Gailen Shelter, [place] 02/20/21 Juliane Poot, MD 02/21/21 1506

## 2021-02-20 NOTE — Progress Notes (Signed)
Spoke with MD and RN at bedside. Will hold off on BiPAP at this time.

## 2021-02-20 NOTE — ED Notes (Addendum)
Pt's pulse ox sat 84% RA, placed on 2L [place] pulse ox 94%. RN made aware.

## 2021-02-20 NOTE — ED Notes (Signed)
Admitting notified about pt's hypoxic episode and pt's BP of 86/64. Pt ate 100% of dinner tray, Pt resting at this time, reporting no complaints or pain.

## 2021-02-20 NOTE — H&P (Signed)
History and Physical    PLEASE NOTE THAT DRAGON DICTATION SOFTWARE WAS USED IN THE CONSTRUCTION OF THIS NOTE.   Brandon Jones BMW:[id number] DOB: May 09, 1961 DOA: 02/20/2021  PCP: Pcp, No Patient coming from: home   I have personally briefly reviewed patient's old medical records in [place]  Chief Complaint: Diminished responsiveness  HPI: Brandon Jones is a 60 y.o. male with medical history significant for multiple prior pulmonary emboli, most recently in May 2021, now chronically anticoagulated on Xarelto, chronic pain syndrome on chronic opioid therapy, who is admitted to [facility] on 5/26/2022with acute encephalopathy after presenting from home to [facility] ED for evaluation of diminished responsiveness.   The following history is provided via my discussions with the patient as well as my discussions with the emergency department physician as well as from EMS report and via chart review.  Per EMS report, patient reportedly was [place] the [place] around 11 AM, which time multiple witnesses reportedly observed the patient to pills out of his pocket before ingesting such.  Within a few minutes of this, these witnesses reported that the patient lay down on the sidewalk, and was difficult to arouse, not responding to verbal or tactile stimuli at that time, prompting these conservative measures to contact EMS, who subsequently brought the patient to [facility] emergency department for further evaluation.  In route to the emergency department, EMS reportedly administered Narcan, with ensuing improvement in the patient's diminished responsiveness.   The patient reports that in the setting of a history of chronic pain syndrome, that he is prescribed as needed oxycodone, noting that he takes oxycodone 7.5 mg, with instructions to take 2 pills 3 times daily as needed.  He reports that he typically uses his full allotment of 6 oxycodone pills  over the course of an average day, but denies any history of taking more than he is prescribed.  He also clarifies that he takes exclusively oxycodone as opposed to Percocet.  He overtly denies any intentional overdose, as well as denying any suicidal ideations.  He specifically reports that he only took 2 of his oxycodone 7.5 mg tabs around 11 AM this morning, and denies taking any other medications at that time.  He denies any recreational drug use, including denying any cocaine use.  He also denies any routine or recent alcohol consumption.   Denies any recent chest pain, shortness of breath, palpitations, diaphoresis, dizziness.  No recent subjective fever, chills, rigors, or generalized myalgias.  He denies any recent headache, neck stiffness, sore throat, wheezing, cough, nausea, vomiting, abdominal pain, diarrhea, or rash.  No recent traveling or known COVID-19 exposures.  No recent dysuria, gross hematuria, or change in urinary urgency/frequency.  Medical history also notable for multiple prior separate acute pulmonary emboli, most recently diagnosed with bilateral PEs in May 2021 within a few weeks of diagnosis of COVID-19 infection.  This is relative to at least 1 prior diagnosis of pulmonary embolism at least 1 year prior for which she completed a 47-monthregimen on Xarelto.  Following diagnosis of bilateral PEs in May 2021, the patient has been prescribed chronic anticoagulation on Xarelto, with reported ensuing outstanding compliance.  Per ensuing discussions with the inpatient pharmacist, they are able to confirmed via review of outpatient refill records that the patient is compliantly filled his prescribed Xarelto on time every month over the last year.  He denies any recent lower extremity edema, calf tenderness, or  new onset lower extremity erythema.  No hemoptysis.  He denies any baseline supplemental oxygen requirements.     ED Course:  Vital signs in the ED were notable for the  following: Temperature max 99.4, heart rate 71-87, intital BP noted to be 84/60, with subsequent improvement to 95/63 following interval IV fluids, as further detailed below; respiratory rate 14-22, oxygen saturation initially noted to be 88% on room air, which is improved to 97 to 99% on 2 L nasal cannula.  There was initial concern from EDP of potential hypercapnia given the patient's presenting somnolence, prompting empiric application of BiPAP, while initial VBG result was pending, and ultimately showed a pre-BiPAP gas of 7.275/52.  Repeat VBG performed approximately 1 hour later while on BiPAP demonstrated 7.272/54.  Labs were notable for the following: BMP was notable for the following: Sodium 138, bicarbonate 23, BUN 12, creatinine 1.47 relative to most recent prior serum creatinine level of 0.78 in May 2021, glucose 110.  CBC notable for white cell count of 15,000.  Initial lactate 3.4, with repeat value trending down to 2.8.  UDS was positive for opioids and cocaine.  Serum acetaminophen level less than 10, salicylate level less than 7, serum ethanol level less than 10.  Screening nasopharyngeal COVID-19/influenza PCR performed in the ED today were found to be negative.  EKG showed sinus rhythm with heart rate 89, and no evidence of T wave or ST changes, including no evidence of ST elevation.  Chest x-ray showed left basilar atelectasis, but otherwise demonstrated no evidence of acute cardiopulmonary process.  While in the ED, the following were administered: Narcan 0.4 mg IV x2, as well as a 2 L normal saline bolus.     Review of Systems: As per HPI otherwise 10 point review of systems negative.   Past Medical History:  Diagnosis Date  . Bilateral pulmonary embolism ([place]) 02/21/2020  . Chronic pain syndrome    on chronic opioid therapy  . COVID-19    May '21    History reviewed. No pertinent surgical history.  Social History:  reports that he has never smoked. He has never used  smokeless tobacco. He reports current alcohol use. He reports previous drug use.   No Known Allergies  Family history reviewed and not pertinent    Prior to Admission medications   Medication Sig Start Date End Date Taking? Authorizing Provider  oxyCODONE-acetaminophen (PERCOCET) 7.5-325 MG tablet Take 1 tablet by mouth 3 (three) times daily as needed for moderate pain.  02/11/20  Yes [provider]  RIVAROXABAN Alveda Reasons) VTE STARTER PACK (15 & 20 MG TABLETS) Follow package directions: Take one 76m tablet by mouth twice a day. On day 22, switch to one 229mtablet once a day. Take with food. Patient taking differently: Take 10 mg by mouth daily with supper. 02/22/20  Yes GrPatrecia PourMD     Objective    Physical Exam: Vitals:   02/20/21 2100 02/20/21 2115 02/20/21 2145 02/20/21 2215  BP: 95/63 94/65 (!) 88/64 94/68  Pulse: 86 74 73 73  Resp: 17 (!) 27 (!) 21 (!) 24  Temp:      TempSrc:      SpO2: 95% 96% 94% 93%  Weight:      Height:        General: appears to be stated age; alert, oriented Skin: warm, dry, no rash Head:  AT/[place] Mouth:  Oral mucosa membranes appear dry, normal dentition Neck: supple; trachea midline Heart:  RRR; did  not appreciate any M/R/G Lungs: CTAB, did not appreciate any wheezes, rales, or rhonchi Abdomen: + BS; soft, ND, NT Vascular: 2+ pedal pulses b/l; 2+ radial pulses b/l Extremities: no peripheral edema, no muscle wasting Neuro: strength and sensation intact in upper and lower extremities b/l    Labs on Admission: I have personally reviewed following labs and imaging studies  CBC: Recent Labs  Lab 02/20/21 1138 02/20/21 1404 02/20/21 1547  WBC 15.0*  --   --   NEUTROABS 12.9*  --   --   HGB 14.3 15.3 14.6  HCT 48.3 45.0 43.0  MCV 79.1*  --   --   PLT 233  --   --    Basic Metabolic Panel: Recent Labs  Lab 02/20/21 1344 02/20/21 1404 02/20/21 1547  NA 138 140 142  K 4.9 4.6 4.2  CL 107  --   --   CO2 23  --   --    GLUCOSE 110*  --   --   BUN 12  --   --   CREATININE 1.47*  --   --   CALCIUM 7.6*  --   --    GFR: Estimated Creatinine Clearance: 54.1 mL/min (A) (by C-G formula based on SCr of 1.47 mg/dL (H)). Liver Function Tests: No results for input(s): AST, ALT, ALKPHOS, BILITOT, PROT, ALBUMIN in the last 168 hours. No results for input(s): LIPASE, AMYLASE in the last 168 hours. No results for input(s): AMMONIA in the last 168 hours. Coagulation Profile: No results for input(s): INR, PROTIME in the last 168 hours. Cardiac Enzymes: No results for input(s): CKTOTAL, CKMB, CKMBINDEX, TROPONINI in the last 168 hours. BNP (last 3 results) No results for input(s): PROBNP in the last 8760 hours. HbA1C: No results for input(s): HGBA1C in the last 72 hours. CBG: No results for input(s): GLUCAP in the last 168 hours. Lipid Profile: No results for input(s): CHOL, HDL, LDLCALC, TRIG, CHOLHDL, LDLDIRECT in the last 72 hours. Thyroid Function Tests: No results for input(s): TSH, T4TOTAL, FREET4, T3FREE, THYROIDAB in the last 72 hours. Anemia Panel: No results for input(s): VITAMINB12, FOLATE, FERRITIN, TIBC, IRON, RETICCTPCT in the last 72 hours. Urine analysis: No results found for: COLORURINE, APPEARANCEUR, [place], [place], GLUCOSEU, HGBUR, BILIRUBINUR, KETONESUR, PROTEINUR, UROBILINOGEN, NITRITE, LEUKOCYTESUR  Radiological Exams on Admission: DG Chest Portable 1 View  Result Date: 02/20/2021 CLINICAL DATA:  Shortness of breath EXAM: PORTABLE CHEST 1 VIEW COMPARISON:  02/20/2020 FINDINGS: No focal consolidation or effusion. Streaky atelectasis left base. Stable cardiomediastinal silhouette. No pneumothorax. IMPRESSION: Streaky atelectasis at the left lung base. Electronically Signed   By: Donavan Foil M.D.   On: 02/20/2021 17:15     EKG: Independently reviewed, with result as described above.    Assessment/Plan   Daquon Greenleaf is a 60 y.o. male with medical history significant for multiple  prior pulmonary emboli, most recently in May 2021, now chronically anticoagulated on Xarelto, chronic pain syndrome on chronic opioid therapy, who is admitted to [facility] on 5/26/2022with acute encephalopathy after presenting from home to [facility] ED for evaluation of diminished responsiveness.    Principal Problem:   Acute encephalopathy Active Problems:   Cocaine abuse (HCC)   Lactic acidosis   AKI (acute kidney injury) ([place])   Leukocytosis   Hypotension     #) Acute encephalopathy: Presenting somnolence and diminished responsiveness, with potential multifactorial contributions, including potential toxic implications in the setting of reports that the patient took a handful of pills within minutes prior  to exhibiting evidence of diminished responsiveness in addition to urinary drug screen found to be positive for cocaine as further detailed above, as well as multiple potential metabolic contributions, including evidence of dehydration, acute kidney injury as well as lactic acidosis.  Of note, the patient denies any intentional overdose, emphatically conveying that he took only his prescribed as needed oxycodone, and denying any history of cocaine use. No suicidal ideations.  Suspect contribution from opioid use, appears supported by patient's improving mental status with multiple doses of as needed Narcan.  At this time, he is alert and oriented, without any evidence of acute focal neurologic deficits to suggest acute CVA.  No evidence of underlying infectious process at this time, including chest x-ray showing no evidence of acute cardiopulmonary process as well as negative COVID-19/influenza PCR performed today.  We will further evaluate by checking urinalysis.  VBG x2, including initial blood gas obtained prior to BiPAP use reflecting no evidence of associated hypercapnia.  Of note, in the setting of concomitant presenting acute kidney injury, patient's use of oxycodone, even at prescribed  dosing/frequency, may have resulted in more pronounced and prolonged systemic effects given interval decline in renal clearance by proximately half relative to most recent prior creatinine data point.    Plan: Nursing bedside swallow evaluation prior to the initiation of a diet/oral medications, as described above. CMP in the morning. Repeat CBC in the morning. Check TSH. check B12 and TSH.  Check urinalysis.  Check ionized calcium level.  Work-up and management presenting lactic acidosis, including continuation of IV fluids, as further detailed below.  Further evaluation management acute kidney injury, as further detailed below.  Consider checking CPK in the setting of AKI and urinary drug screen positive for cocaine, depending upon ensuing result of urinalysis, particularly of such demonstrates evidence suggestive of myoglobinuria.  As needed Narcan.  Hold home as needed oxycodone.     #) Acute hypoxic respiratory distress: In the setting of no baseline supplemental oxygen requirements, initial O2 sat noted to be in the high 80s, with ensuing improvement into the mid to high 90s on 2 L nasal cannula.  Suspect an element of diminished respiratory drive Recent opioid use, particularly given improving somnolence with as needed Narcan doses, as above.  Of note, chest x-ray shows left basilar atelectasis, but otherwise no evidence of acute cardiopulmonary process, no clinical or radiographic evidence to suggest acute decompensated heart failure.  Additionally, ACS appears less likely given the patient's report of no recent chest pain, while presenting EKG shows no evidence of acute ischemic changes.  Acute pulmonary embolism also appears to be less likely given the patient's report of good compliance with him chronic anticoagulation on Xarelto, which appears supported by my discussions with the patient pharmacy that was able to confirm outpatient full records collaborating the patient's report good compliance,  as further detailed above.  Additionally, COVID-19/influenza PCR checked today found to be negative.   Plan: Monitor on continuous pulse oximetry as well as telemetry.  As needed Narcan.  Hold him oxycodone for now.  Incentive spirometry ordered.  Repeat CBC in the morning.  Add on BNP.  Check serum magnesium level.      #) Cocaine abuse: Potential cocaine abuse suspected given the patient's presenting acute encephalopathy as well as urinary drug screen positive for cocaine, although the patient denies any history of such.   Plan: Counseled the patient on the importance of refraining from recreational drug use.  Also placed a consult with the  transition of care team.  Further evaluation management of presenting lactic acidosis, including interval IV fluid administration, as further described below.  Further evaluation and management of presenting AKI, as above.      #) Acute Kidney Injury: Presenting serum creatinine noted to be 1.47 relative to most recent prior serum creatinine data point  Of 0.78 when checked 1 year ago.  Suspect prerenal contributions from diminished renal perfusion as a consequence of presenting borderline hypotension as well as diminished oxygen carrying capacity/oxygen delivery capacity in the context of presenting acute hypoxic respiratory distress, as further detailed above.  Additionally, the patient clinically appears mildly dehydrated, with dry appearing oral mucosa membranes.  This interval decline in renal function poses potential pharmacologic ramifications given the patient's daily use of as needed oxycodone, as further detailed above.    Plan: monitor strict I's & O's and daily weights. Attempt to avoid nephrotoxic agents, and will hold as needed oxycodone for now, pending ensuing renal improvement. Refrain from NSAIDs. Repeat BMP in the morning. Check serum magnesium level. Check random urine sodium and random urine creatinine.  Check urinalysis with microscopy,  with consideration for checking CPK if ensuing urinalysis is suggestive of findings consistent with myoglobinuria.  Lactated Ringer's at 125 cc/h.  Further evaluation management of presenting acute hypoxic respiratory distress, as further detailed above.       #) Lactic acidosis: Presenting lactate noted to be elevated at 3.2, with interval trend down to 2.8 following interval administration of IV fluids.  Potentially multifactorial in nature, with possible contributions from acute kidney injury as well as generalized mild tissue hypoperfusion in the context of borderline hypotension as well as associated diminished perfusion on the basis of decline in oxygen delivery capacity due to presenting acute hypoxic respiratory distress, as above.  Additionally, differential includes cocaine use, which can result in direct lactic acidosis, particularly given presenting urinary drug screen found to be positive for cocaine.  We will also check INR to evaluate hepatic synthetic function.  Of note, no evidence of underlying infectious process at this time.  Therefore, criteria are not met at present for diagnosis of underlying sepsis.  Of note, patient's lactic acidosis is not associated with acidemia per results of VBG x2 performed in the ED today.   Plan: Continuous IV fluids via lactated Ringer's, as above.  Repeat lactate in 3 hours.  Counseled the patient on the importance of refraining from recreational drug use, including that of cocaine.  Close monitoring of ensuing renal function, as further detailed above.  Repeat BMP in the morning.  Follow-up result urinalysis.  Check INR.       #) Leukocytosis: Presenting CBC reflects mildly elevated white blood cell count of 15,000, with suspected contribution from hemoconcentration due to clinical evidence for dehydration.  There may also be an inflammatory component, given potential cocaine abuse, as further detailed above.  No evidence of underlying infectious  process at this time, as further detailed above.  We will further evaluate with urinalysis.  Plan: Check urinalysis.  Repeat CBC in the morning.  IV fluids, as above.      #) Hypotension: Borderline hypotension, subsequently improving following interval administration of IV fluids suggestive of element of dehydration, while maintaining maps greater than 65 throughout his ED course.  Differential also includes cardiogenic etiology given urinary drug screen positive for cocaine.  However, this possibility appears less likely at the present time given no significant evidence of acutely decompensated heart failure either clinically or radiographically.  Additionally, ACS is felt to be less likely at the present time given no reported recent chest pain, while presenting EKG shows no evidence of acute ischemic changes.  However, if persistent hypotension in spite of intravascular volume repletion, could consider pursuing echocardiogram.  Differential also includes contribution from oxycodone, particularly given diminished renal clearance of such in the setting of acute kidney injury, with decline in renal clearance by approximately 50% relative to most recent prior renal function, as further detailed above.  Obstructive source from acute pulmonary embolism less likely given apparent outstanding compliance with home chronic anticoagulation on Xarelto.  No clinical evidence of underlying sepsis or anaphylaxis.  No evidence of acute blood loss.   Plan: Continue IV fluids, as above.  Repeat CBC in the morning.  Continue to hold home oxycodone, as needed Narcan.  Monitor on telemetry.  Monitor continuous pulse oximetry.  Further evaluation and management of presenting AKI, as above.  Add on BNP.       #) History of bilateral pulmonary emboli: Most recently diagnosed with such in May 2021, after previous diagnosis of pulmonary embolism approximately 1 to 2 years prior.  Following diagnosis of bilateral  pulmonary emboli in May 2021, the patient has been on chronic anticoagulation with Xarelto with reported ensuing outstanding compliance with this anticoagulant, which appears verified via inpatient pharmacy result of outpatient full records, as further detailed above.  Plan: Continue home Xarelto.      DVT prophylaxis: Continue home Xarelto Code Status: Full code Family Communication: none Disposition Plan: Per Rounding Team Consults called: none  Admission status: Observation; PCU     Of note, this patient was added by me to the following Admit List/Treatment Team: [email or web address].      PLEASE NOTE THAT DRAGON DICTATION SOFTWARE WAS USED IN THE CONSTRUCTION OF THIS NOTE.   [place] [facility] Pager [phone] From [place]  Otherwise, please contact night-coverage  [email or web address] Password [facility]   02/20/2021, 10:24 PM

## 2021-02-21 ENCOUNTER — Other Ambulatory Visit: Payer: Self-pay

## 2021-02-21 DIAGNOSIS — I959 Hypotension, unspecified: Secondary | ICD-10-CM

## 2021-02-21 DIAGNOSIS — G9340 Encephalopathy, unspecified: Secondary | ICD-10-CM

## 2021-02-21 DIAGNOSIS — F1410 Cocaine abuse, uncomplicated: Secondary | ICD-10-CM | POA: Diagnosis not present

## 2021-02-21 DIAGNOSIS — N179 Acute kidney failure, unspecified: Secondary | ICD-10-CM | POA: Diagnosis not present

## 2021-02-21 DIAGNOSIS — E872 Acidosis, unspecified: Secondary | ICD-10-CM | POA: Diagnosis present

## 2021-02-21 DIAGNOSIS — D72829 Elevated white blood cell count, unspecified: Secondary | ICD-10-CM

## 2021-02-21 LAB — COMPREHENSIVE METABOLIC PANEL
ALT: 1220 U/L — ABNORMAL HIGH (ref 0–44)
AST: 1125 U/L — ABNORMAL HIGH (ref 15–41)
Albumin: 2.9 g/dL — ABNORMAL LOW (ref 3.5–5.0)
Alkaline Phosphatase: 40 U/L (ref 38–126)
Anion gap: 6 (ref 5–15)
BUN: 12 mg/dL (ref 6–20)
CO2: 24 mmol/L (ref 22–32)
Calcium: 8.4 mg/dL — ABNORMAL LOW (ref 8.9–10.3)
Chloride: 108 mmol/L (ref 98–111)
Creatinine, Ser: 1.13 mg/dL (ref 0.61–1.24)
GFR, Estimated: >60 mL/min (ref >60)
Glucose, Bld: 98 mg/dL (ref 70–99)
Potassium: 4 mmol/L (ref 3.5–5.1)
Sodium: 138 mmol/L (ref 135–145)
Total Bilirubin: 0.8 mg/dL (ref 0.3–1.2)
Total Protein: 5 g/dL — ABNORMAL LOW (ref 6.5–8.1)

## 2021-02-21 LAB — CBC
HCT: 39.5 % (ref 39.0–52.0)
Hemoglobin: 12 g/dL — ABNORMAL LOW (ref 13.0–17.0)
MCH: 23.5 pg — ABNORMAL LOW (ref 26.0–34.0)
MCHC: 30.4 g/dL (ref 30.0–36.0)
MCV: 77.3 fL — ABNORMAL LOW (ref 80.0–100.0)
Platelets: 133 10*3/uL — ABNORMAL LOW (ref 150–400)
RBC: 5.11 MIL/uL (ref 4.22–5.81)
RDW: 15.1 % (ref 11.5–15.5)
WBC: 12.2 10*3/uL — ABNORMAL HIGH (ref 4.0–10.5)
nRBC: 0 % (ref 0.0–0.2)

## 2021-02-21 LAB — PROTIME-INR
INR: 3.4 — ABNORMAL HIGH (ref 0.8–1.2)
Prothrombin Time: 34.5 seconds — ABNORMAL HIGH (ref 11.4–15.2)

## 2021-02-21 LAB — TSH: TSH: 0.249 u[IU]/mL — ABNORMAL LOW (ref 0.350–4.500)

## 2021-02-21 LAB — MAGNESIUM: Magnesium: 1.7 mg/dL (ref 1.7–2.4)

## 2021-02-21 LAB — AMMONIA: Ammonia: 37 umol/L — ABNORMAL HIGH (ref 9–35)

## 2021-02-21 LAB — BRAIN NATRIURETIC PEPTIDE: B Natriuretic Peptide: 253.5 pg/mL — ABNORMAL HIGH (ref 0.0–100.0)

## 2021-02-21 LAB — HIV ANTIBODY (ROUTINE TESTING W REFLEX): HIV Screen 4th Generation wRfx: NONREACTIVE

## 2021-02-21 NOTE — Discharge Summary (Signed)
Physician Discharge Summary  Brandon Jones AGT:[id number] DOB: [birth year] DOA: 02/20/2021  PCP: Pcp, No  Admit date: 02/20/2021 Discharge date: 02/21/2021  Admitted From: Home Disposition: Home   Recommendations for Outpatient Follow-up:  1. Follow up with PCP in 1-2 weeks. Need repeat CMP, CBC, PT/INR, and thyroid function testing, all of which were abnormal in the setting of acute polysubstance intoxication.  Home Health: None Equipment/Devices: None Discharge Condition: Stable CODE STATUS: Full Diet recommendation: Regular  Brief/Interim Summary: Brandon Jones is a 60 y.o. 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 This improved some with narcan by EMS.  In the ED, temp 99.37F, HR 71-87, intital BP noted to be 84/60, with subsequent improvement to 95/63 following interval IV fluids, as further detailed below; respiratory rate 14-22, oxygen saturation initially noted to be 88% on room air, which is improved to 97 to 99% on 2 L nasal cannula. CXR with left base atelectasis only and oxygen subsequently weaned. LFTs were grossly elevated, INR 3.4, platelets 133. The patient was admitted and with IV fluids AKI resolved, lactic acid improved, had complete clearing of his mental status, has no complaints and requests discharge. Despite recommendations for work up of LFT abnormalities, suppressed TSH (0.249) and coagulopathy, he declines and will be discharged with PCP follow up with strict caution against substance use. He has his home medications.   Discharge Diagnoses:  Principal Problem:   Acute encephalopathy Active Problems:   Cocaine abuse (HCC)   Lactic acidosis   AKI (acute kidney injury) (HCC)   Leukocytosis   Hypotension    Discharge Instructions Discharge Instructions    Discharge instructions   Complete by: As directed    You were evaluated for  confusion and unresponsiveness which has improved. You are at your physical and mental baseline, so you are able to safely discharge from the hospital. It was recommended for you to stay an additional day to confirm your liver function and other lab abnormalities improve, as you may experience liver failure. You understand that this will require very close follow up with Dr. Dennie Bible, your PCP, early next week and that you need to present to the ER sooner if you experience abdominal pain, nausea, vomiting, yellowing skin, confusion, or trouble walking/balancing. Abstain from all sedating and illicit medications.     Allergies as of 02/21/2021   No Known Allergies     Medication List    STOP taking these medications   oxyCODONE-acetaminophen 7.5-325 MG tablet Commonly known as: PERCOCET     TAKE these medications   Rivaroxaban Stater Pack (15 mg and 20 mg) Commonly known as: XARELTO STARTER PACK Follow package directions: Take one 15mg  tablet by mouth twice a day. On day 22, switch to one 20mg  tablet once a day. Take with food. What changed:   how much to take  how to take this  when to take this  additional instructions       Follow-up Information    [facility] Follow up.   Contact information: [address] [place] [phone]             No Known Allergies  Consultations:  None  Procedures/Studies: DG Chest Portable 1 View  Result Date: 02/20/2021 CLINICAL DATA:  Shortness of breath EXAM: PORTABLE CHEST 1 VIEW COMPARISON:  02/20/2020 FINDINGS: No focal  consolidation or effusion. Streaky atelectasis left base. Stable cardiomediastinal silhouette. No pneumothorax. IMPRESSION: Streaky atelectasis at the left lung base. Electronically Signed   By: Jasmine Pang M.D.   On: 02/20/2021 17:15     Subjective: Feels well, knows where he is, the date and time, and is preoccupied with getting his car. He has no  abdominal pain, no recent or current bleeding, chest pain or dyspnea. He can tell me the address of his PCP and plans to follow up there. States he understands my recommendation to pursue expedited work up of multiple lab abnormalities and that these suggest there could be life threatening problems for him, but chooses to leave anyway.   Discharge Exam: Vitals:   02/21/21 0025 02/21/21 0600  BP: 93/63 (!) 94/55  Pulse: 74 76  Resp: 19 18  Temp: 98.2 F (36.8 C) (!) 97.5 F (36.4 C)  SpO2: 94%    General: Pt is alert, awake, not in acute distress Cardiovascular: RRR, S1/S2 +, no rubs, no gallops Respiratory: CTA bilaterally, no wheezing, no rhonchi Abdominal: Soft, NT, ND, bowel sounds + Extremities: No edema, no cyanosis  Labs: BNP (last 3 results) Recent Labs    02/21/21 0527  BNP 253.5*   Basic Metabolic Panel: Recent Labs  Lab 02/20/21 1344 02/20/21 1404 02/20/21 1547 02/20/21 2340  NA 138 140 142 138  K 4.9 4.6 4.2 4.0  CL 107  --   --  108  CO2 23  --   --  24  GLUCOSE 110*  --   --  98  BUN 12  --   --  12  CREATININE 1.47*  --   --  1.13  CALCIUM 7.6*  --   --  8.4*  MG  --   --   --  1.7   Liver Function Tests: Recent Labs  Lab 02/20/21 2340  AST 1,125*  ALT 1,220*  ALKPHOS 40  BILITOT 0.8  PROT 5.0*  ALBUMIN 2.9*   No results for input(s): LIPASE, AMYLASE in the last 168 hours. Recent Labs  Lab 02/21/21 0747  AMMONIA 37*   CBC: Recent Labs  Lab 02/20/21 1138 02/20/21 1404 02/20/21 1547 02/20/21 2340  WBC 15.0*  --   --  12.2*  NEUTROABS 12.9*  --   --   --   HGB 14.3 15.3 14.6 12.0*  HCT 48.3 45.0 43.0 39.5  MCV 79.1*  --   --  77.3*  PLT 233  --   --  133*   Cardiac Enzymes: No results for input(s): CKTOTAL, CKMB, CKMBINDEX, TROPONINI in the last 168 hours. BNP: Invalid input(s): POCBNP CBG: No results for input(s): GLUCAP in the last 168 hours. D-Dimer No results for input(s): DDIMER in the last 72 hours. Hgb A1c No results  for input(s): HGBA1C in the last 72 hours. Lipid Profile No results for input(s): CHOL, HDL, LDLCALC, TRIG, CHOLHDL, LDLDIRECT in the last 72 hours. Thyroid function studies Recent Labs    02/21/21 0527  TSH 0.249*   Anemia work up No results for input(s): VITAMINB12, FOLATE, FERRITIN, TIBC, IRON, RETICCTPCT in the last 72 hours. Urinalysis No results found for: COLORURINE, APPEARANCEUR, LABSPEC, PHURINE, GLUCOSEU, HGBUR, BILIRUBINUR, KETONESUR, PROTEINUR, UROBILINOGEN, NITRITE, LEUKOCYTESUR  Microbiology Recent Results (from the past 240 hour(s))  Resp Panel by RT-PCR (Flu A&B, Covid) Nasopharyngeal Swab     Status: None   Collection Time: 02/20/21  7:15 PM   Specimen: Nasopharyngeal Swab; Nasopharyngeal(NP) swabs in vial transport medium  Result  Value Ref Range Status   SARS Coronavirus 2 by RT PCR NEGATIVE NEGATIVE Final    Comment: (NOTE) SARS-CoV-2 target nucleic acids are NOT DETECTED.  The SARS-CoV-2 RNA is generally detectable in upper respiratory specimens during the acute phase of infection. The lowest concentration of SARS-CoV-2 viral copies this assay can detect is 138 copies/mL. A negative result does not preclude SARS-Cov-2 infection and should not be used as the sole basis for treatment or other patient management decisions. A negative result may occur with  improper specimen collection/handling, submission of specimen other than nasopharyngeal swab, presence of viral mutation(s) within the areas targeted by this assay, and inadequate number of viral copies(<138 copies/mL). A negative result must be combined with clinical observations, patient history, and epidemiological information. The expected result is Negative.  Fact Sheet for Patients:  BloggerCourse.com  Fact Sheet for Healthcare Providers:  SeriousBroker.it  This test is no t yet approved or cleared by the [place] FDA and  has been authorized  for detection and/or diagnosis of SARS-CoV-2 by FDA under an Emergency Use Authorization (EUA). This EUA will remain  in effect (meaning this test can be used) for the duration of the COVID-19 declaration under Section 564(b)(1) of the Act, 21 U.S.C.section 360bbb-3(b)(1), unless the authorization is terminated  or revoked sooner.       Influenza A by PCR NEGATIVE NEGATIVE Final   Influenza B by PCR NEGATIVE NEGATIVE Final    Comment: (NOTE) The Xpert Xpress SARS-CoV-2/FLU/RSV plus assay is intended as an aid in the diagnosis of influenza from Nasopharyngeal swab specimens and should not be used as a sole basis for treatment. Nasal washings and aspirates are unacceptable for Xpert Xpress SARS-CoV-2/FLU/RSV testing.  Fact Sheet for Patients: BloggerCourse.com  Fact Sheet for Healthcare Providers: SeriousBroker.it  This test is not yet approved or cleared by the [place] FDA and has been authorized for detection and/or diagnosis of SARS-CoV-2 by FDA under an Emergency Use Authorization (EUA). This EUA will remain in effect (meaning this test can be used) for the duration of the COVID-19 declaration under Section 564(b)(1) of the Act, 21 U.S.C. section 360bbb-3(b)(1), unless the authorization is terminated or revoked.  Performed at [facility], [address] [place], [place] [address]     Time coordinating discharge: Approximately 40 minutes  Tyrone Nine, MD  [facility] 02/21/2021, 12:28 PM

## 2021-02-21 NOTE — Plan of Care (Signed)

## 2021-02-22 LAB — CALCIUM, IONIZED: Calcium, Ionized, Serum: 4.9 mg/dL (ref 4.5–5.6)

## 2021-02-26 LAB — METHYLMALONIC ACID, SERUM: Methylmalonic Acid, Quantitative: 93 nmol/L (ref 0–378)

## 2022-10-11 IMAGING — DX DG CHEST 1V PORT
1 series · 1 of 1 positions shown · non-contrast
Comparison: 02/20/2020

CLINICAL DATA: Shortness of breath

EXAM:
PORTABLE CHEST 1 VIEW

[chest ap]
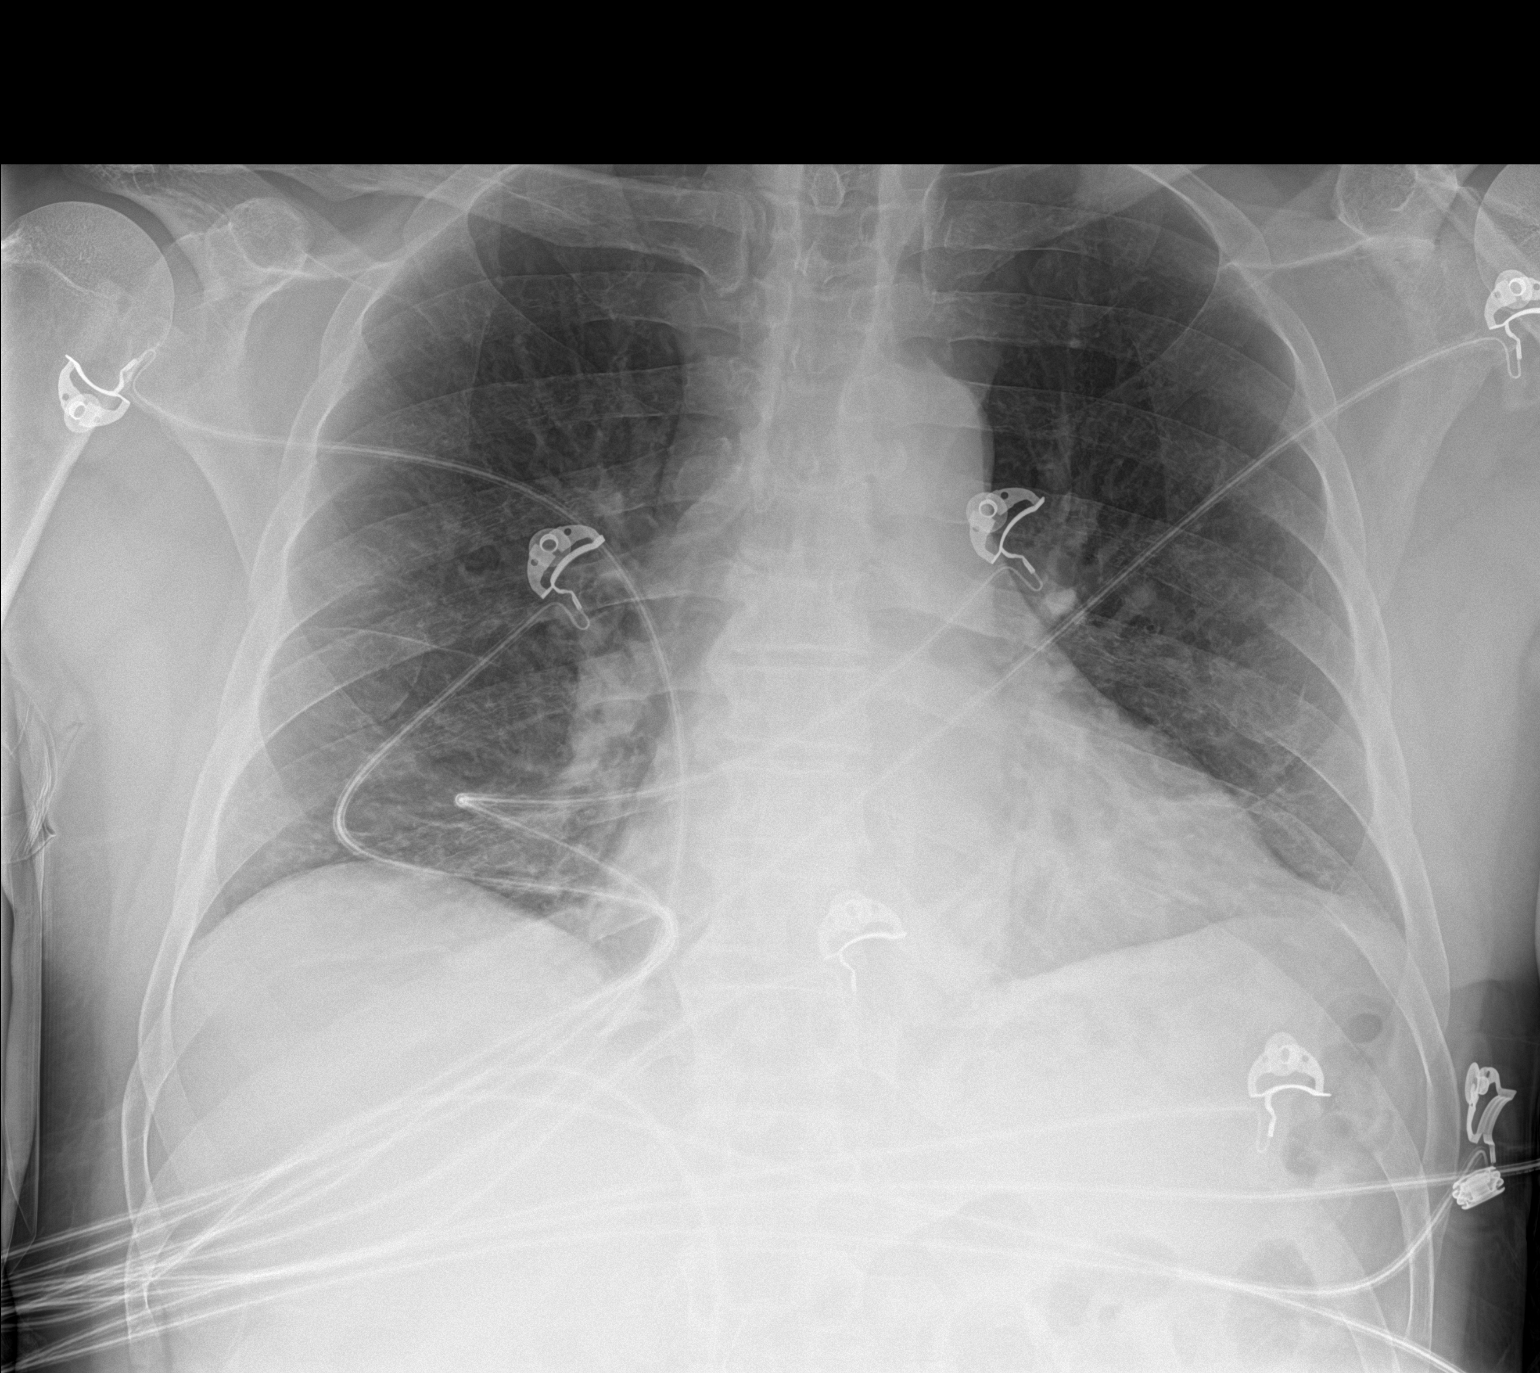

[1 of 1 positions shown; findings below may reference images not displayed]

FINDINGS: No focal consolidation or effusion. Streaky atelectasis left base.
Stable cardiomediastinal silhouette. No pneumothorax.
IMPRESSION: Streaky atelectasis at the left lung base.
